# Patient Record
Sex: Male | Born: 1960 | Race: White | Hispanic: No | Marital: Single | Smoking: Never smoker
Health system: Southern US, Community
[De-identification: ages and names within clinical notes are randomized; demographics above are authoritative.]

## PROBLEM LIST (undated history)

## (undated) DIAGNOSIS — S2239XA Fracture of one rib, unspecified side, initial encounter for closed fracture: Secondary | ICD-10-CM

## (undated) DIAGNOSIS — R112 Nausea with vomiting, unspecified: Secondary | ICD-10-CM

## (undated) DIAGNOSIS — K219 Gastro-esophageal reflux disease without esophagitis: Secondary | ICD-10-CM

## (undated) DIAGNOSIS — Z9889 Other specified postprocedural states: Secondary | ICD-10-CM

## (undated) DIAGNOSIS — I10 Essential (primary) hypertension: Secondary | ICD-10-CM

---

## 2007-02-04 ENCOUNTER — Encounter
Admission: RE | Admit: 2007-02-04 | Discharge: 2007-05-05 | Payer: Self-pay | Admitting: Physical Medicine & Rehabilitation

## 2007-02-04 ENCOUNTER — Ambulatory Visit: Payer: Self-pay | Admitting: Physical Medicine & Rehabilitation

## 2007-03-18 ENCOUNTER — Ambulatory Visit: Payer: Self-pay | Admitting: Physical Medicine & Rehabilitation

## 2007-03-28 ENCOUNTER — Encounter: Admission: RE | Admit: 2007-03-28 | Discharge: 2007-03-28 | Payer: Self-pay | Admitting: Orthopedic Surgery

## 2007-04-07 HISTORY — PX: JOINT REPLACEMENT: SHX530

## 2007-04-11 ENCOUNTER — Encounter
Admission: RE | Admit: 2007-04-11 | Discharge: 2007-07-10 | Payer: Self-pay | Admitting: Physical Medicine & Rehabilitation

## 2007-04-25 ENCOUNTER — Ambulatory Visit: Payer: Self-pay | Admitting: Physical Medicine & Rehabilitation

## 2007-05-02 ENCOUNTER — Inpatient Hospital Stay (HOSPITAL_COMMUNITY): Admission: RE | Admit: 2007-05-02 | Discharge: 2007-05-05 | Payer: Self-pay | Admitting: Orthopedic Surgery

## 2007-06-02 ENCOUNTER — Ambulatory Visit: Payer: Self-pay | Admitting: Physical Medicine & Rehabilitation

## 2007-07-07 ENCOUNTER — Encounter
Admission: RE | Admit: 2007-07-07 | Discharge: 2007-10-05 | Payer: Self-pay | Admitting: Physical Medicine & Rehabilitation

## 2007-07-11 ENCOUNTER — Ambulatory Visit: Payer: Self-pay | Admitting: Physical Medicine & Rehabilitation

## 2007-07-25 ENCOUNTER — Ambulatory Visit: Payer: Self-pay | Admitting: Physical Medicine & Rehabilitation

## 2007-09-21 ENCOUNTER — Encounter
Admission: RE | Admit: 2007-09-21 | Discharge: 2007-09-26 | Payer: Self-pay | Admitting: Physical Medicine & Rehabilitation

## 2007-09-26 ENCOUNTER — Ambulatory Visit: Payer: Self-pay | Admitting: Physical Medicine & Rehabilitation

## 2007-12-16 ENCOUNTER — Encounter
Admission: RE | Admit: 2007-12-16 | Discharge: 2008-03-15 | Payer: Self-pay | Admitting: Physical Medicine & Rehabilitation

## 2007-12-19 ENCOUNTER — Ambulatory Visit: Payer: Self-pay | Admitting: Physical Medicine & Rehabilitation

## 2008-01-02 ENCOUNTER — Ambulatory Visit: Payer: Self-pay | Admitting: Physical Medicine & Rehabilitation

## 2008-02-13 ENCOUNTER — Ambulatory Visit: Payer: Self-pay | Admitting: Physical Medicine & Rehabilitation

## 2008-04-27 ENCOUNTER — Encounter
Admission: RE | Admit: 2008-04-27 | Discharge: 2008-07-26 | Payer: Self-pay | Admitting: Physical Medicine & Rehabilitation

## 2008-04-30 ENCOUNTER — Ambulatory Visit: Payer: Self-pay | Admitting: Physical Medicine & Rehabilitation

## 2008-05-21 ENCOUNTER — Ambulatory Visit: Payer: Self-pay | Admitting: Physical Medicine & Rehabilitation

## 2008-05-28 ENCOUNTER — Ambulatory Visit: Payer: Self-pay | Admitting: Physical Medicine & Rehabilitation

## 2008-06-04 ENCOUNTER — Ambulatory Visit: Payer: Self-pay | Admitting: Physical Medicine & Rehabilitation

## 2008-06-18 ENCOUNTER — Ambulatory Visit: Payer: Self-pay | Admitting: Physical Medicine & Rehabilitation

## 2008-08-01 ENCOUNTER — Encounter
Admission: RE | Admit: 2008-08-01 | Discharge: 2008-10-30 | Payer: Self-pay | Admitting: Physical Medicine & Rehabilitation

## 2008-08-06 ENCOUNTER — Ambulatory Visit: Payer: Self-pay | Admitting: Physical Medicine & Rehabilitation

## 2008-08-13 ENCOUNTER — Ambulatory Visit: Payer: Self-pay | Admitting: Physical Medicine & Rehabilitation

## 2008-08-20 ENCOUNTER — Ambulatory Visit: Payer: Self-pay | Admitting: Physical Medicine & Rehabilitation

## 2008-08-27 ENCOUNTER — Ambulatory Visit: Payer: Self-pay | Admitting: Physical Medicine & Rehabilitation

## 2008-11-20 ENCOUNTER — Encounter
Admission: RE | Admit: 2008-11-20 | Discharge: 2008-11-26 | Payer: Self-pay | Admitting: Physical Medicine & Rehabilitation

## 2008-11-26 ENCOUNTER — Ambulatory Visit: Payer: Self-pay | Admitting: Physical Medicine & Rehabilitation

## 2009-05-23 ENCOUNTER — Encounter
Admission: RE | Admit: 2009-05-23 | Discharge: 2009-05-27 | Payer: Self-pay | Admitting: Physical Medicine & Rehabilitation

## 2009-05-27 ENCOUNTER — Ambulatory Visit: Payer: Self-pay | Admitting: Physical Medicine & Rehabilitation

## 2009-10-11 ENCOUNTER — Emergency Department (HOSPITAL_BASED_OUTPATIENT_CLINIC_OR_DEPARTMENT_OTHER): Admission: EM | Admit: 2009-10-11 | Discharge: 2009-10-11 | Payer: Self-pay | Admitting: Emergency Medicine

## 2009-10-11 ENCOUNTER — Ambulatory Visit: Payer: Self-pay | Admitting: Diagnostic Radiology

## 2009-10-24 ENCOUNTER — Encounter
Admission: RE | Admit: 2009-10-24 | Discharge: 2010-01-22 | Payer: Self-pay | Admitting: Physical Medicine & Rehabilitation

## 2009-10-31 ENCOUNTER — Ambulatory Visit: Payer: Self-pay | Admitting: Physical Medicine & Rehabilitation

## 2009-12-02 ENCOUNTER — Ambulatory Visit: Payer: Self-pay | Admitting: Physical Medicine & Rehabilitation

## 2010-04-07 ENCOUNTER — Ambulatory Visit: Payer: Self-pay | Admitting: Physical Medicine & Rehabilitation

## 2010-04-07 ENCOUNTER — Encounter
Admission: RE | Admit: 2010-04-07 | Discharge: 2010-05-06 | Payer: Self-pay | Source: Home / Self Care | Attending: Physical Medicine & Rehabilitation | Admitting: Physical Medicine & Rehabilitation

## 2010-08-19 NOTE — Procedures (Signed)
Anthony [name], Anthony [name]                 ACCOUNT NO.:  [id number]   MEDICAL RECORD NO.:  [id number]          PATIENT TYPE:  REC   LOCATION:  [facility]                          FACILITY:  [facility]   PHYSICIAN:  Erick Colace, M.D.DATE OF BIRTH:  11-10-1960   DATE OF PROCEDURE:  11/26/2008  DATE OF DISCHARGE:                               OPERATIVE REPORT   PROCEDURE:  Left sacroiliac injection L4 medial branch block, L5-S1, S2-  S3 dorsal ramus injections under fluoroscopic guidance.   INDICATIONS:  Lumbar facet low back pain and sacroiliac disorder.   Informed consent was obtained after describing risks and benefits of the  procedure with the patient.  These include bleeding, bruising,  infection, he elects to proceed and has given written consent.  The  patient placed prone on fluoroscopy table.  Betadine prep, sterile  drape, 25-gauge inch and half needle was used to anesthetize the skin  and subcutaneous tissue, 1% lidocaine x2 mL, then 20 gauge 10-cm RF  needle with 10-mm curved active tip was inserted under fluoroscopic  guidance first starting in the left S1 SAP sacral ala junction, bone  contact made, confirmed with lateral imaging.  Sensory stem at 50 Hz  followed by motor stem at 2 Hz, confirmed proper needle location  followed by injection of 0.75 mL of dexamethasone lidocaine solution.  The left S1 lateral aspect of the sacral foramen was approximated with  the needle.  Sensory stem at 50 Hz confirmed proper needle location  followed by injection of 0.75 mL of dexamethasone lidocaine solution and  radiofrequency lesioning at 70 degrees Celsius for 70 seconds within the  left S2.  Lateral aspect of the foramen was approximated with the needle  followed bone contact was made.  Sensory stem at 50 Hz confirmed proper  needle location followed by injection of 1 mL of 0.75 mL of  dexamethasone lidocaine solution.  Then, the left S3 lateral aspect of  the sacral foramen was approximated  with the needle, bone contact made.  Sensory stem at 50 Hz confirmed proper needle location followed by  injection of 0.75 mL of dexamethasone lidocaine solution.  The patient  tolerated the procedure well.  Pre and post injection vitals stable.  Post injection instructions given.  Return in 1 month for right-side  injection.      Erick Colace, M.D.  Electronically Signed     AEK/MEDQ  D:  11/26/2008 09:38:07  T:  11/26/2008 23:35:25  Job:  [id number]

## 2010-08-19 NOTE — Procedures (Signed)
NAMEJOVANN, LUSE                 ACCOUNT NO.:  [id number]   MEDICAL RECORD NO.:  [id number]          PATIENT TYPE:  REC   LOCATION:  [facility]                          FACILITY:  [facility]   PHYSICIAN:  Erick Colace, M.D.DATE OF BIRTH:  [birth year]   DATE OF PROCEDURE:  DATE OF DISCHARGE:                               OPERATIVE REPORT   Mr. Schuld returns today.  This is a diagnosis of lumbosacral pain.   PROCEDURE:  Left L4 medial branch, left L5 dorsal ramus radiofrequency  neurotomy, left S1, S2, S3 dorsal ramus radiofrequency neurotomy.  This  is under lumbar and sacral fluoroscopic guidance.   The patient has pain, which interferes with self-care mobility and job  as responded to at his procedure in the past, last performed  approximately 6 months ago.   Informed consent was obtained after describing the risks and benefits of  the procedure with the patient.  These include bleeding, bruising,  infection, he elects to proceed and has given written consent.  The  patient placed prone on fluoroscopy table.  Betadine prep, sterile drape  25-gauge inch-and-a-half needle was used to anesthetize skin and  subcutaneous tissue, 1% lidocaine x2 mL.  A 22-gauge 3.5-inch spinal  needle was inserted under fluoroscopic guidance first starting the left  S1 SAP sacral junction, bone contact made, confirmed with lateral  imaging.  Sensory stem at 50 Hz followed by motor stem at 2 Hz confirmed  proper needle location followed by injection of 1 mL of solution  containing 1 mL of 4 mg/mL dexamethasone, 4 mL of 1% MPF lidocaine  followed by radiofrequency lesioning at 70 degrees Celsius for 70  seconds.  Then left L5 SAP transverse process junction targeted, bone  contact made, confirmed with lateral imaging.  Sensory stem at 50 Hz  followed by motor stem at 2 Hz confirmed proper needle location followed  by injection of 1 mL of dexamethasone and lidocaine solution and  radiofrequency lesioning at  70 degrees Celsius x 70 seconds.  Then, the  left lateral aspect of the left S1 foramen was targeted, needle was made  just lateral to the foramen, and bone contact made.  Sensory stem at 50  Hz confirmed proper needle location followed by injection of 1 mL of  dexamethasone lidocaine solution and radiofrequency lesioning 70 degrees  Celsius for 70 seconds.  This was performed once again at S2 and at S3  using same needle technique and procedure.  The patient tolerated  procedure well.  Postprocedure instructions given.  Preinjection pain  level 4/10.  Post injection 1/10.      Erick Colace, M.D.  Electronically Signed     AEK/MEDQ  D:  06/18/2008 17:11:13  T:  06/19/2008 07:27:15  Job:  [id number]

## 2010-08-19 NOTE — Procedures (Signed)
NAMESTEVENSON, Anthony [name]                 ACCOUNT NO.:  [id number]   MEDICAL RECORD NO.:  [id number]           PATIENT TYPE:   LOCATION:                                 FACILITY:   PHYSICIAN:  Erick Colace, M.D.DATE OF BIRTH:  10-03-1960   DATE OF PROCEDURE:  DATE OF DISCHARGE:                               OPERATIVE REPORT   ADDENDUM   PROCEDURE:  Left L4 medial-branch radiofrequency neurotomy, left L5-S1,  S2-S3 dorsal ramus radiofrequency neurotomy, and left sacroiliac joint  injection.   INDICATION:  1. Lumbar pain and further radiofrequency procedures.  2. 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  The      patient's pain has failed to respond to a 3 months of conserve      management consisting of medications, physical therapy, and home      exercise program.  He does maintain a home exercise program, has      gone through physical therapy in the past as well as medication      management.  He has responded to control diagnostic medial-branch      block under fluoroscopic guidance with at least 50% reduction of      pain and has had prior successful radiofrequency neurotomy with the      last procedure having been performed approximately 6 months ago.   Informed consent was obtained after describing risks and benefits of the  procedure with the patient.  These include bleeding, bruising, and  infection.  He elects to proceed and has given written consent.  The  patient was placed prone on fluoroscopy table.  Betadine prep and  sterile drape.  1% lidocaine x9 mL injected along the lumbosacral  paraspinal area followed by insertion of a 20-gauge 10-cm RF needle with  10-mm curved active tip, first charting left S1 SAP sacral ala junction,  bone contact made, confirmed with lateral imaging.  Sensory stem at 50  Hz followed by motor stem at 2 Hz confirmed proper needle location  followed by injection of 0.75 mL of the solution containing 1 mL of 4  mg/mL,  dexamethasone 1mL of 1% MPF lidocaine followed by radiofrequency  lesioning 70 degrees Celsius for 70 seconds in the left L5, SAP  transverse process junction, targeted bone contact made, confirmed with  lateral images.  Sensory stem at 50 Hz followed by motor stem at 2 Hz  confirmed proper needle location followed by injection of 1 mL and then  0.75 mL of dexamethasone lidocaine solution.  The left S1 sacral foramen  lateral aspect was targeted bone contact made.  Sensory stem at 50 Hz,  confirmed proper needle location followed by injection of 0.75 mL of  dexamethasone lidocaine solution and radiofrequency lesioning 70 degrees  Celsius x70 seconds in the left S2 lateral aspect of the sacral foramen.  Targeted bone contact made.  Sensory stem at 50 Hz, confirmed proper  needle location followed by injection of 0.75 mL of dexamethasone  lidocaine solution followed by radiofrequency lesioning 70 degrees  Celsius for 70 seconds and the left S3 lateral aspect of  the S3 foramen  with targeted bone contact made.  Sensory stem at 50 Hz confirmed proper  needle location followed by injection of 0.75 mL of the dexamethasone  lidocaine solution and radiofrequency lesioning 70 degrees Celsius for  70 seconds.  The patient tolerated the procedure well.  Pre and post  injection vitals stable.  Post injection instructions given.  In  addition, the sacroiliac was injected targeting the sacroiliac joint  under AP and lateral imaging using a 25 gauge 3  inches spinal needle.  Omnipaque 180 and confirmed proper needle  location followed by injection of 1 cm of a solution containing 1.5 mL  for 4 mg/mL dexamethasone and 1.5 mL of 1% lidocaine.      Erick Colace, M.D.  Electronically Signed     AEK/MEDQ  D:  11/26/2008 09:47:01  T:  11/26/2008 23:19:27  Job:  [id number]

## 2010-08-19 NOTE — Procedures (Signed)
Anthony [name], Anthony [name]                 ACCOUNT NO.:  [id number]   MEDICAL RECORD NO.:  [id number]          PATIENT TYPE:  REC   LOCATION:  [facility]                          FACILITY:  [facility]   PHYSICIAN:  Erick Colace, M.D.DATE OF BIRTH:  [birth year]   DATE OF PROCEDURE:  05/28/2008  DATE OF DISCHARGE:                               OPERATIVE REPORT   Oswestry score today is 26%.  Acupuncture visit indication is shoulder  pain, postherpetic neuralgia.   Needles placed at SI10, SJ14, LI14, SI14, BL11, BL12, BL13, BL14, 4 Hz  stimulation x30 minutes.  The patient tolerated the procedure well.      Erick Colace, M.D.  Electronically Signed     AEK/MEDQ  D:  05/28/2008 16:33:44  T:  05/29/2008 04:05:37  Job:  [id number]

## 2010-08-19 NOTE — Procedures (Signed)
Anthony [name], Anthony [name]                 ACCOUNT NO.:  [id number]   MEDICAL RECORD NO.:  [id number]           PATIENT TYPE:   LOCATION:                                 FACILITY:   PHYSICIAN:  Erick Colace, M.D.DATE OF BIRTH:  26-Jan-1961   DATE OF PROCEDURE:  DATE OF DISCHARGE:                               OPERATIVE REPORT   This is acupuncture treatment.   INDICATIONS:  Sacroiliac disorder as well as left parascapular  myofascial pain.   Pain is only partial responsive to conservative care including physical  therapy.   The patient has been benefiting from acupuncture treatments and his last  treatment was performed on Aug 20, 2008, and this will be the seventh in  a series since February 2010.   The patient placed prone on exam table.  Acupuncture needles placed in  Baldry technique left SI 11.  Then, needles placed at bilateral BL 21,  BL 27, left BL 23, left BL 25, left GB 30, as well as left BL 51, 52,  53, 54, 4 Hz stimulation 30 minutes.  The patient tolerated the  procedure well.  Postprocedure instructions given.  Return in 2 weeks.      Erick Colace, M.D.  Electronically Signed     AEK/MEDQ  D:  08/27/2008 11:49:18  T:  08/28/2008 04:53:41  Job:  [id number]

## 2010-08-19 NOTE — Procedures (Signed)
Anthony [name], Anthony [name]                 ACCOUNT NO.:  [id number]   MEDICAL RECORD NO.:  [id number]          PATIENT TYPE:  REC   LOCATION:  [facility]                          FACILITY:  [facility]   PHYSICIAN:  Erick Colace, M.D.DATE OF BIRTH:  10-Jun-1960   DATE OF PROCEDURE:  DATE OF DISCHARGE:                               OPERATIVE REPORT   INDICATION:  Sacroiliac disorder as well as infraspinatus myofascial  pain.   Pain is only partially responsive to medication management and other  conservative care and impairs his mobility and self-care.   The patient was placed prone on exam table.  Acupuncture needle was  placed in Baldry technique, left SI 11 as well as needles placed at  bilateral BL21, BL27 as well as the left BL23, left BL25, and left GB30  as well as left BL51, BL52, BL53, BL54; 4 Hz stimulation x30 minutes.  The patient tolerated the procedure well.  Postprocedure instructions  given.      Erick Colace, M.D.  Electronically Signed     AEK/MEDQ  D:  08/06/2008 16:55:39  T:  08/07/2008 09:15:26  Job:  [id number]

## 2010-08-19 NOTE — H&P (Signed)
NAMEZORIAN, GUNDERMAN                 ACCOUNT NO.:  [id number]   MEDICAL RECORD NO.:  [id number]          PATIENT TYPE:  INP   LOCATION:  NA                           FACILITY:  [facility]   PHYSICIAN:  Ollen Gross, M.D.    DATE OF BIRTH:  30-Jan-1961   DATE OF ADMISSION:  05/02/2007  DATE OF DISCHARGE:                              HISTORY & PHYSICAL   DATE OF OFFICE VISIT HISTORY AND PHYSICAL:  04/12/2007   CHIEF COMPLAINT:  Right hip pain.   HISTORY OF PRESENT ILLNESS:  The patient is a  50 year old male whose  seen by Dr. Lequita Halt for ongoing hip pain.  He has known end-stage  arthritis that has progressively gotten worse with time.  He is seen and  the x-rays in the office show that he has bone-on-bone in the right hip,  loss of the joint space. It is felt he has reached a point where he  could benefit under surgical intervention.  The risks and benefits have  been discussed.  He elects to proceed with surgery.   ALLERGIES:  PENICILLIN which Dr. Leonette Most has it documented as an  anaphylactic type reaction.   CURRENT MEDICATIONS:  Mobic, AcipHex, pravastatin. He has also been on  glucosamine complex.   PAST MEDICAL HISTORY:  History of peptic ulcer disease, history of  bronchitis, allergic rhinitis, degenerative disk disease, esophageal  reflux, hiatal hernia, hyperlipidemia, insomnia, mild hypertension,  irritable bowel syndrome, diverticulosis.   PAST SURGICAL HISTORY:  Right shoulder and elbow surgery, left shoulder  surgery.  He has also undergone an EGD and colonoscopy.   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 His wife will be assisting with  care after surgery.  One-story home with two small steps entering the  house.   FAMILY HISTORY:  Father with history of heart disease.  Family history  also is noted for hypertension, diabetes, arthritis.   REVIEW OF SYSTEMS:  GENERAL:  No fevers, chills, night  sweats.  NEUROLOGIC:  No seizures, syncope or paralysis.  RESPIRATORY:  No  shortness of breath, productive cough or hemoptysis.  He has had a  recent bout of bronchitis.  He has already been treated with Mucinex in  a Z-Pak.  He was already seen in his medical Clinic prior to surgery.  CARDIOVASCULAR:  No chest pain, angina, orthopnea. GI: No nausea,  vomiting, diarrhea or constipation.  GU: No dysuria, hematuria, or  discharge.   PHYSICAL EXAM:  VITAL SIGNS:  Pulse 64, respirations 12, blood pressure  112/84.  GENERAL:  A 50 year old white male well-nourished, well-developed in no  acute distress. Alert, oriented and cooperative, accompanied by his  wife.  HEENT:  Normocephalic, atraumatic.  Pupils round and reactive.  Oropharynx clear.  EOMs intact.  NECK:  Supple.  CHEST:  Slight rhonchi at the bases on the right otherwise clear  throughout.  HEART:  Regular rate and rhythm.  ABDOMEN:  Soft, nontender.  Bowel sounds present.  RECTAL/BREASTS/GENITALIA:  Not done not pertinent to present illness.  EXTREMITIES:  Right hip  flexion 90, 0 internal rotation, 10 degrees  external rotation, 10 degrees abduction.   IMPRESSION:  1. Osteoarthritis right hip.  2. Peptic ulcer disease.  3. History of bronchitis.  4. Allergic rhinitis.  5. Degenerative disk disease.  6. Esophageal reflux.  7. Hiatal hernia.  8. Hyperlipidemia.  9. Insomnia.  10.Mild hypertension.  11.Irritable bowel syndrome.  12.Diverticulosis.   PLAN:  The patient admitted to [facility] to undergo a right  total hip. The surgery will be performed by Dr. Ollen Gross.      Alexzandrew L. Perkins, P.A.C.      Ollen Gross, M.D.  Electronically Signed    ALP/MEDQ  D:  05/01/2007  T:  05/02/2007  Job:  [id number]   cc:   Loyal Jacobson, MD  [facility]  [address]  [place], [place] [address]

## 2010-08-19 NOTE — Procedures (Signed)
Anthony [name], Anthony [name]                 ACCOUNT NO.:  [id number]   MEDICAL RECORD NO.:  [id number]          PATIENT TYPE:  REC   LOCATION:  [facility]                          FACILITY:  [facility]   PHYSICIAN:  Erick Colace, M.D.DATE OF BIRTH:  10/28/1960   DATE OF PROCEDURE:  06/13/2007  DATE OF DISCHARGE:                               OPERATIVE REPORT   Left L4 medial branch block L5 dorsal ramus injection, left S1, S2, and  S3 dorsal ramus injection, left sacroiliac injection and radiofrequency  neurotomy under fluoroscopic guidance.   Anthony [name] is here for his radiofrequency, sacroiliac, left side.   INDICATIONS:  Sacroiliac pain only partially responsive to medication  management and only short-term relief with sacroiliac injections.     Informed consent was obtained after describing the risks and benefits of  the procedure to the patient.  These include bleeding, bruising,  infection, temporary or permanent paralysis.  He elects to proceed and  has given written consent.  The patient placed prone on fluoroscopy  table.  Betadine prep, sterile drape.  A 25-gauge 1-1/2-inch needle was  used to anesthetize skin and subcutaneous tissue with 1% lidocaine x2  mL.  Then, a 25-gauge 3-inch spinal needle was inserted in the left SI  joint with AP lateral oblique imaging.  After negative drawback for  blood, 1 mL of 2% MPF lidocaine plus 0.5% mL of 40 mg/mL Depo-Medrol was  injected.  Then, the 20-gauge 10-cm RF needle with the 10-mm curved  active tip was inserted under fluoroscopic guidance, first targeting the  lateral aspect of the left S3 foramen under fluoroscopic imaging.  Bone  contact made.  Sensory stem 50 Hz confirmed proper needle location  followed by injection of 1 mL of a solution containing 4 mL of 1% MPF  lidocaine and 1 mL of 40 mg/mL dexamethasone followed by radiofrequency  lesioning 70 degrees for 70 seconds.  Then, the lateral aspect of left  S2 foramen was similarly  approached with radiofrequency needle.  Bone  contact was made.  Sensory stem confirmed proper needle location,  followed by injection of 1 mL of the dexamethasone/lidocaine solution  and radiofrequency lesioning 70 degrees Celsius for 70 seconds.  Then,  the left S1 foramen lateral aspect was approached using the RF needle.  Bone contact made, confirmed with sensory stimulation and lateral  imaging, followed by injection 1 mL of the dexamethasone/lidocaine  solution and radiofrequency lesioning 70 degrees Celsius for 70 seconds.  Then, the left S1 SAP sacral ala junction targeted.  Bone contact made,  confirmed with lateral imaging.  Sensory stem at 50 Hz followed by motor  stem 2 Hz demonstrated proper needle location followed by injection of 1  mL of the dexamethasone/lidocaine solution and radiofrequency lesioning  70 degrees for 70 seconds.  Then, the left L5 SAP sacral ala junction  targeted.  Bone contact made, confirmed with lateral imaging.  Sensory  stem at 50 Hz followed by motor stem at 2 Hz confirmed proper needle  location followed by injection of 1 mL of the dexamethasone/lidocaine  solution and radiofrequency  lesioning 70 degrees for 70 seconds.  The  patient tolerated the  procedure well.  Pre/post injection vitals stable.  Post injection  instructions given.  Return in 1 month for follow-up appointment.      Erick Colace, M.D.  Electronically Signed     AEK/MEDQ  D:  06/13/2007 10:24:29  T:  06/14/2007 09:10:39  Job:  [id number]   cc:   Ollen Gross, M.D.  Fax: [phone]

## 2010-08-19 NOTE — Procedures (Signed)
NAMEBRANSTON, [name]                 ACCOUNT NO.:  [id number]   MEDICAL RECORD NO.:  [id number]          PATIENT TYPE:  REC   LOCATION:  [facility]                          FACILITY:  [facility]   PHYSICIAN:  Erick Colace, M.D.DATE OF BIRTH:  02-03-1961   DATE OF PROCEDURE:  07/25/2007  DATE OF DISCHARGE:                               OPERATIVE REPORT   PROCEDURE:  Right L4 medial branch radiofrequency neurotomy.  Right L5 dorsal ramus radiofrequency neurotomy.  Right S1, S2, S3 dorsal ramus radiofrequency neurotomy.  Fluoroscopic guidance of both sacral and lumbar areas.   INDICATION:  Lumbar facet syndrome and sacroiliac disorder.  Lumbosacral  spondylosis without myelopathy.  721.2.   Right-sided low back and buttock pain only partially relieved with  medication management interfering with activity level.  She had positive  results from left-sided treatment.   Informed consent was obtained after describing risks and benefits of the  procedure with the patient.  These include bleeding, bruising,  infection.  He elects to proceed and has given written consent. The  patient placed prone on fluoroscopy table.  Betadine prep, sterile  drape.  A 25-gauge inch and a half needle was used to anesthetize skin  and subcu tissue.  1% lidocaine x2 mL.  Then a 20 gauge 10-cm RF needle  with 10 mm curved active tip was inserted under fluoroscopic guidance,  first targeting the right S1 SAP sacral ala junction. Bone contact made,  confirmed with lateral imaging.  Sensory stim at 50 Hz followed by motor  stim at 2 Hz confirmed proper needle location followed by injection 1/2  mL of a solution containing 1 mL of 4 mg per mL dexamethasone, 2 mL of  1% MPF lidocaine followed by radiofrequency lesioning at 70 degrees  Celsius for 70 seconds.  In the right L5 SAP transverse process junction  targeted.  Bone contact made confirming lateral imaging.  Sensory stim  at 50 Hz followed by motor stim at 2 Hz  confirmed proper needle location  followed by injection of 0.5 mL of the dexamethasone lidocaine solution  and radiofrequency lesioning 70 degrees x 70 seconds.  Then the  lateral  aspect of the S1 neural foramen was targeted.  Bone contact made.  Sensory stim at 50 Hz confirmed proper needle location followed by  injection of 1/2 mL of dexamethasone lidocaine solution and  radiofrequency lesioning 70 degrees for 70 seconds.  The right S2  foramen lateral aspect was then targeted. Bone contact made.  Sensory  stim at 50 Hz confirmed proper needle location followed by injection of  1/2 mL of dexamethasone lidocaine solution and radiofrequency at 70  degrees for 70 seconds.  The right S3 neural foramen was targeted. Bone  contact made, confirmed with sensory stimulation 50 Hz followed by  injection 1/2 mL of dexamethasone lidocaine solution and radiofrequency  70 degrees x 70 seconds.  The patient tolerated procedure well.  Pre and post injection vitals  stable.  Post injection instructions given.  Follow with me in 1 month.      Erick Colace, M.D.  Electronically Signed     AEK/MEDQ  D:  07/25/2007 09:45:34  T:  07/25/2007 10:05:33  Job:  [id number]   cc:   Ollen Gross, M.D.  Fax: [phone]

## 2010-08-19 NOTE — Procedures (Signed)
[name], [name]                 ACCOUNT NO.:  [id number]   MEDICAL RECORD NO.:  [id number]          PATIENT TYPE:  REC   LOCATION:  [facility]                          FACILITY:  [facility]   PHYSICIAN:  Erick Colace, M.D.DATE OF BIRTH:  13-Nov-1960   DATE OF PROCEDURE:  03/21/2007  DATE OF DISCHARGE:                               OPERATIVE REPORT   PROCEDURE:  Bilateral L5 dorsal ramus injection and radiofrequency  neurotomy, bilateral L4 medial branch blocks and radiofrequency  neurotomy and bilateral L3 medial branch blocks and radiofrequency  neurotomy under fluoroscopic guidance.   INDICATIONS:  Lumbar facet mediated pain.  He had good relief of his  axial back pain on the left side.  When he had procedure done on the  left side on February 28, 2007, he noticed increased pain on the right  side comparatively and therefore will get bilateral injections today.  His pain is only partially responsive to medication management.  He has  confounding variable of right hip OA altering gait pattern as well.   Informed consent was obtained after describing risks and benefits of the  procedure to the patient.  These include bleeding, bruising, infection,  loss of bowel and bladder function, temporary or permanent paralysis.  He elects to proceed and has given written consent.  The patient placed  prone on fluoroscopy table.  Betadine prep, sterile drape, 25-gauge inch  and a half needle was used to anesthetize the skin and subcu tissue 1%  lidocaine x2 mL at each of six sites.  Then a 22 gauge three and a half  inch spinal needle was inserted, first targeting the left S1 SAP sacral  ala junction.  Bone contact made, confirmed  with lateral imaging.  Omnipaque 180 under live fluoro demonstrated no intravascular uptake.  Then a solution and 1 mL of 4 mg/mL dexamethasone 2 mL of 2% MPF  lidocaine x0.5 mL were injected.  Then the left L5 SAP transverse  process junction targeted.  Bone contact  made and confirmed with lateral  imaging.  Omnipaque 180 x0.5 mL demonstrated no intravascular uptake.  Then 0.5 mL of the dexamethasone lidocaine solution was injected.  Then  the left L4 SAP transverse process junction targeted.  Bone contact  made, confirmed with lateral imaging.  Omnipaque 180 x0.5 mL  demonstrated no intravascular uptake and 0.5 mL of dexamethasone  lidocaine solution was injected.  The same procedure was then done on  the corresponding levels on the right side with the same equipment,  injectate and technique.  The patient tolerated the procedure well.  Pre  and post injection vitals stable.  Pre-injection pain level 5/10, post  injection 0/10.  Will have return follow-up appointment for one month.  Discuss RF versus other treatment options depending on his pain level.  Also need to coordinate in regards to his hip surgery that is upcoming  in six weeks.      Erick Colace, M.D.  Electronically Signed     AEK/MEDQ  D:  03/21/2007 15:29:29  T:  03/22/2007 09:54:30  Job:  [id number]

## 2010-08-19 NOTE — Procedures (Signed)
Anthony [name], Anthony [name]                 ACCOUNT NO.:  [id number]   MEDICAL RECORD NO.:  [id number]          PATIENT TYPE:  INP   LOCATION:  NA                           FACILITY:  [facility]   PHYSICIAN:  Erick Colace, M.D.DATE OF BIRTH:  01-May-1960   DATE OF PROCEDURE:  04/25/2007  DATE OF DISCHARGE:                               OPERATIVE REPORT   This is bilateral sacroiliac injection under fluoroscopic guidance.   The patient returns today.  He is scheduled for right hip replacement  and would like some relief of his sacroiliac pain prior to his scheduled  surgery.   Pain is only partially responsive to medication management,   pain interferes with his mobility.   Informed consent was obtained after describing risks and benefits to the  patient.  These include bleeding, bruising, infection, he elects to  proceed and has given written consent.  The patient placed prone on  fluoroscopy table.  Betadine prep, sterile drape,  25-gauge 1-1/2 inch  needle was used to anesthetize skin and subcu tissue 1% lidocaine x2 ccs  and then a 25 gauge 3 inches spinal needle was inserted first the left  SI joint AP lateral oblique imaging.  Omnipaque 180 demonstrated no  intravascular uptake then a solution containing 0.5 mL of 40 mg per mL  Depo-Medrol and 1 mL of 2% MPF lidocaine.  The same procedure was  repeated on the right side given the increasing symptomatology that side  using same needle, injectate and technique.   The patient tolerated procedure well.  Pre injection pain level 09/10  post injection 5/10.  He will see me in 2 months.  Will see how he does  postoperatively with his hip replacement and decide whether he needs  sacroiliac radiofrequency procedure at the time.      Erick Colace, M.D.  Electronically Signed     AEK/MEDQ  D:  04/25/2007 12:16:37  T:  04/25/2007 12:58:10  Job:  [id number]   cc:   Ollen Gross, M.D.  Fax: [phone]

## 2010-08-19 NOTE — Group Therapy Note (Signed)
REASON FOR CONSULTATION:  Sacroiliac pain evaluation for further  treatment.   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  He has had about a 20% relief with sacroiliac injections, which  has been done on several occasions per Dr. Ethelene Hal.  He has had no  significant relief with L5-S1 epidural steroid injection.  He also has  right hip OA.   He has endstage DJD of the right hip and is scheduled for a hip  replacement in January.  He feels that the way he walks has been altered  by his arthritis, however, his pain in his left hip is worse with  prolonged sitting.   He has been on Mobic; this has helped moderately.  He has tried narcotic  analgesics and he cannot even tolerate Tramadol.   His other past medical history is significant for carpal tunnel release  bilaterally, he has had a right ulnar nerve transposition.  In addition,  he has had right distal biceps tendon rupture.   CURRENT MEDICATIONS:  1. Meloxicam 15 mg a day.  2. Pravastatin 40 mg a day.  3. Aciphex 20 mg a day.  4. He has taken tramadol at times, but, once again, this causes      sedation.   His pain is sharp, but not constant, but occurs with prolonged sitting  and bending.  Average pain 4-5; improved with therapy, medication, TENS,  and injections.  He can walk 1 hour at a time, he climbs steps, and he  drives.  He used to work 50 hours a week.   REVIEW OF SYSTEMS:  Positive for nausea, diarrhea, abdominal pain and  has a diagnosis of IBS.   PAST HISTORY:  As noted above.   SOCIAL HISTORY:  Is married, has a child, who is with him today, who is  59 years old.   FAMILY HISTORY:  Heart disease, lung disease, diabetes, high blood  pressure, and alcohol abuse.   OTHER THERAPIES TRIED:  He has been getting some chiropractic treatments  with adjustments as well as integrative therapy for more  myofascial  approach as well as postural approach.  He has tried some bracing on his  right lower extremity.   PHYSICAL EXAMINATION:  GENERAL:  In no acute distress.  Mood and affect  appropriate.  Blood pressure 108/77, pulse 81, respiratory rate 18, and O2 sating 97%  on room air.  His gait does tend to externally rotate his right hip when he walks.  He  walks on the lateral border of the right foot, he does have a callus  over the right foot.  Sensation is intact in the lower extremities and  upper extremities with the exception of left L4 dermatome, which is  mildly reduced to pinprick compared to the right side.  His motor  strength is 4 bilateral deltoid, biceps, triceps, grip as well as hip  flexor, knee extensor, ankle dorsiflexor and EHL.  He has some  difficulty walking on the toes as well as the heel.  He states that his  balance is off due to his hip arthritis on the right side.  He has some  tenderness to the left PSIS.  He has positive Faber's, referring pain to  the left PSIS area.  His hip range of motion has decreased internal  rotation on the right side; external rotation is intact.  His back has some tenderness with some right-sided lateral bending, not  left-sided, and not particularly with extension.   IMPRESSION:  Left lower back and buttock pain.  He has some elements  suggestive of sacroiliac process.  He has no history of psoriasis or  inflammatory bowel disease or any other problem that predisposes him for  sacroiliitis per se.  He has had fairly minimal relief with sacroiliac  injections, per his report about 20%, so that may not be the only pain  generator in that area.  Discussed other potential pain generators  including lumbar facet and would like to check medial branch blocks to  the left L4-L5, left L5-S1 to see how much they contribute.  Certainly  sacroiliac radiofrequency would include medial branch blocks and RF of  those medial branches in  addition to the S1, S2 and S3.  I explained to  the patient that if he is one of the patients that has some ventral  contribution to the SI joint, the radiofrequency would not have as good  a result as if it was a posteriorly innervated SI.   We will set him up for the medial branches and proceed from there.  He  can continue with his PT as well as his chiropractic treatment.  He will  need to come off his Mobic for the injections.  I told him we should  know within a couple of weeks after the medial branch blocks whether we  will be ending up doing the lumbar and sacroiliac RF.   Thank you very much for this interesting consultation.      Erick Colace, M.D.  Electronically Signed     AEK/MedQ  D:  02/07/2007 16:09:46  T:  02/08/2007 12:57:25  Job #:  [id number]

## 2010-08-19 NOTE — Procedures (Signed)
NAMESHAKIR, PETROSINO                 ACCOUNT NO.:  [id number]   MEDICAL RECORD NO.:  [id number]         PATIENT TYPE:  aecp   LOCATION:                                 FACILITY:   PHYSICIAN:  Erick Colace, M.D.DATE OF BIRTH:  24-Oct-1960   DATE OF PROCEDURE:  DATE OF DISCHARGE:                               OPERATIVE REPORT   INDICATIONS:  Left trochanteric bursitis.   Left hip marked and prepped with Betadine after obtaining informed  consent where I described the risks and benefits of the procedure.  He  has given written consent.  Area marked prepped with Betadine, entered  with 25-gauge inch and a half needle, 1 mL of 40 mg Depo-Medrol plus 4  mL of 1% lidocaine were injected after negative drawback for blood.  The  patient tolerated the procedure well.  Post-injection instructions  given.  He has had pain that persists despite NSAIDs, and interferes  with ambulation.      Erick Colace, M.D.  Electronically Signed     AEK/MEDQ  D:  12/19/2007 15:30:53  T:  12/20/2007 05:21:35  Job:  [id number]

## 2010-08-19 NOTE — Assessment & Plan Note (Signed)
This is a followup visit.  The patient did well with procedure on  January 02, 2008.  Had an episode, where he hurt all over when the  weather changed.  This did not apply just to his back, but also to his  leg and hip area.  His average pain is down to 3/10 and interferes with  activity at a moderate level.  Pain is worse in the morning hours.  Relief from meds is good, but he does not tolerate any of the stronger  pain medications.  He is taking Mobic.  Even Ultram seems to give him  some problems in terms of drowsiness.   He has had no other new medical problems.  He is employed 40 hours a  week.   Oswestry disability index today 18%, putting him in the mild or minimal  disability range.   PHYSICAL EXAMINATION:  VITAL SIGNS:  His blood pressure is 124/71, pulse  54, respirations 18, and O2 sat 95% on room air.  GENERAL:  In no acute distress.  Orientation x3.  Affect is alert.  Gait  is normal.  EXTREMITIES:  Without edema.  Coordination normal.  He has good hip  internal and external rotation.  Normal lower extremity strength.  No  tenderness over the PSIS area.  His back range of motion is good.   IMPRESSION:  1. 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  2. Probable diffuse osteoarthritis, had had a flare-up, but now      improved.   We will have him continue on Mobic and given samples of Voltaren gel.   We discussed other options in terms of pain management including  prolotherapy of the SI joint as well as acupuncture.  He has tried  acupuncture and has had success in the past, may consider it again.      Erick Colace, M.D.  Electronically Signed     AEK/MedQ  D:  02/13/2008 11:31:59  T:  02/13/2008 22:59:46  Job #:  [id number]   cc:   Ollen Gross, M.D.  Fax: [phone]

## 2010-08-19 NOTE — Assessment & Plan Note (Signed)
HISTORY OF PRESENT ILLNESS:  Mr. Gunner returns today complaining  originally for Acupuncture, but also complaining of increasing pain in  the low back area.  He has pain mainly on the left side and the hip  area.  His head has no new trauma.  He has been able to work full time.  He has had no bowel or bladder dysfunction, and he has had no lower  extremity weakness or numbness.   PHYSICAL EXAMINATION:  EXTREMITIES:  Pain in the SI area.  Normal gait.  Pain score in the 3-4/10 range.   REVIEW OF CHART:  His last radiofrequency procedure was January 02, 2008, which is approximately 5 months ago.  This is likely starting to  wear off.  We will repeat procedure in one month.   I have discussed with the patient, and he is in agreement.      Erick Colace, M.D.  Electronically Signed     AEK/MedQ  D:  05/28/2008 16:36:21  T:  05/29/2008 04:57:30  Job #:  [id number]

## 2010-08-19 NOTE — Assessment & Plan Note (Signed)
Anthony [name] returns today.  He has undergone a right sacroiliac  radiofrequency ablation on July 25, 2007.  He underwent left side on  June 13, 2007.  He has had good relief of his sacroiliac-mediated pain.  He continues to have good recovery from his right total hip replacement  which was done in January 2009.  He is back to his usual activities  times a couple of months now.  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  He has had no postprocedural complications.  His main  complaint at this time is some hip pain, but this is more on the outer  quadrant of the buttock, left is greater than the right.  He is status  post right THA.   He does have some pain with prolonged sitting, but this is relieved,  combination of Mobic and occasional Tylenol.  He is receiving Mobic from  his primary care physician.   He has had no other medical complaints over the last couple of months.  His average pain is around 4/10.  He does have some posterior thigh  radiation at times with prolonged sitting.  He can walk 60 minutes at a  time.  He is employed 40 hours a week.  His review of systems is  otherwise negative.   PHYSICAL EXAMINATION:  VITAL SIGNS:  His blood pressure is 122/68, pulse  61, respiratory rate 18, and O2 sat 97% on room air.  GENERAL:  In no acute distress.  Mood and affect appropriate.  BACK:  Good range of motion, forward flexion, but mildly limited in  extension, but this is not accompanied by pain.  He has no pain over the  PSIS.  He does have some tenderness around the greater trochanter  bilaterally.  His lower extremity strength is normal.  Deep tendon  reflexes are normal.  Knee and ankle range of motion are normal.  Straight leg raising test is negative.  Gait is normal.  Extremities  without edema.   IMPRESSION:  1. 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  2. Bilateral hip enthesopathy versus trochanteric bursitis, left      greater than  right.  3. Status post right total hip arthroplasty.   PLAN:  At this point I have instructed Anthony [name] on tensor fascia lata  stretching exercises, also stretching of the external rotators of the  hip.   I instructed him that if this is not helpful over the next month or so,  to make an appointment to do a hip trochanteric bursa injection.   Otherwise, I will see him in about 5 months, which would put him about 8  months post radiofrequency ablation on the left side.  We will see how  he is doing and decide whether repeat radiofrequency ablation would be  needed or whether he can wait.      Erick Colace, M.D.  Electronically Signed     AEK/MedQ  D:  09/26/2007 08:39:49  T:  09/26/2007 22:18:35  Job #:  [id number]   cc:   Ollen Gross, M.D.  Fax: [phone]

## 2010-08-19 NOTE — Assessment & Plan Note (Signed)
PROCEDURE:  Acupuncture treatment.   INDICATION:  Pain related to postherpetic neuralgia, left shoulder  region.   Pain is only partially responsive to medication management.   DESCRIPTION OF PROCEDURE:  Needle was placed at SI 10, SJ 14, L1 14, S1  14, BL 11, BL 12, BL 13, BL 14, and forward stimulation x30 minutes.  The patient tolerated the procedure well.  Postprocedure instruction  given.      Erick Colace, M.D.  Electronically Signed     AEK/MedQ  D:  06/04/2008 17:19:59  T:  06/05/2008 62:95:28  Job #:  [id number]

## 2010-08-19 NOTE — Assessment & Plan Note (Signed)
DATE OF LAST VISIT:  June 13, 2007   Patient has done well with his left buttock pain status post left L4-L5  medial branch dorsal ramus injection and radiofrequency.  Also including  left S1, S2, S3 dorsal ramus RFA.  He has had no new problems, other  than he has now noticed the right-sided pain.  He is planning to go back  to work in a couple of weeks and is concerned that the right-sided pain  may inhibit his work activities.   He is independent with his self-care and mobility.  Prolonged sitting  does aggravate his left buttock pain.  His recovery from his hip surgery  is going well.  He has followed up with Dr. Lequita Halt.   His blood pressure is 126/73.  Pulse 76.  Respiratory rate 18.  O2 sat  is 96% room air.  GENERAL:  No acute distress.  Mood and affect appropriate.  His back has tenderness over the right PSIS, not over the left side.  He  has pain when coming up from a forward flexed position.  His lower  extremity strength is normal.  Deep tendon reflexes are normal.  Gait  shows some mild antalgia favoring the right lower extremity.   IMPRESSION:  Low back buttock pain improvement after left-sided L4  medial branch block RFA, L5 doral ramus injection RFA S1, S2, S3.  We  will schedule for the right side.      Erick Colace, M.D.  Electronically Signed     AEK/MedQ  D:  07/11/2007 08:37:27  T:  07/11/2007 09:14:36  Job #:  [id number]   cc:   Ollen Gross, M.D.  Fax: [phone]

## 2010-08-19 NOTE — Procedures (Signed)
Anthony [name], Anthony [name]                 ACCOUNT NO.:  [id number]   MEDICAL RECORD NO.:  [id number]          PATIENT TYPE:  REC   LOCATION:  [facility]                          FACILITY:  [facility]   PHYSICIAN:  Erick Colace, M.D.DATE OF BIRTH:  09/18/1960   DATE OF PROCEDURE:  08/20/2008  DATE OF DISCHARGE:                               OPERATIVE REPORT   A 50 year old male being treated with acupuncture for his left  infraspinatus pain and muscle dysfunction related to zoster as well as  his sacroiliac discomfort.  His pain is only partially responsive to  medication management and interferes with mobility.   Prior procedure was performed 1 week ago.   PROCEDURE:  Acupuncture needles 40-mm placed using the Daldry technique  left SI-11 and needles placed at bilateral BL 21, BL 27, left BL 23, BL  25, left BG 30, , left BL 51, left BL 52, left BL 53, 4 Hz stimulation  x30 minutes.  The patient tolerated the procedure well.  Postprocedure  instructions given.  I will see him back in 1-2 weeks for repeat  procedure.       Erick Colace, M.D.  Electronically Signed     AEK/MEDQ  D:  08/20/2008 15:42:10  T:  08/21/2008 05:50:15  Job:  [id number]

## 2010-08-19 NOTE — Assessment & Plan Note (Signed)
A 50 year old male who I last saw on February 13, 2008.  He has a history  of right hip OA status post TAH, 1 year ago today.  He has had  sacroiliac radiofrequency just prior to that.   He had a repeat procedure in March 2009 and in April.  March, on left  and April, on the right, and then underwent a left repeat in September.  He also underwent left troch bursa injection in September.   In terms of sacroiliac, he is doing quite well.  His main complaint is  twofold left hip which interferes with sleep.  This does not interfere  as much with walking and the other issue is new onset shingles which  also has been over the last month or so.  This involves shoulder down  his arm into his left thumb.   His average pain is 6/10, up to 8 at times, mainly in the shoulder  greater than the hip.  The hip hurts more at night.  He did see ER,  given Vicodin which he really cannot take during the day, makes him too  drowsy as well as Neurontin which makes him too drowsy during the day.  He has not tried Lidoderm for this, but has tried this in the past for  other conditions.   PHYSICAL EXAMINATION:  VITAL SIGNS:  His blood pressure is 131/99, pulse  79, respirations 18, O2 sat 97% room air.  GENERAL:  No acute stress.  Orientation x3.  Affect is alert.  Gait is  normal.  EXTREMITIES:  Without edema.  He does have some skin discoloration, but  no pustules in the left upper arm shoulder area and no lesions below the  elbow area.  He has hypersensitivity to touch over the C6 dermatome on  the left side only.   His motor strength is full in the upper extremities.  His left hip has  tenderness to palpation over the greater trochanter.  His lower  extremity strength is normal.  No pain over the left PSIS.  He has  normal range of motion in the lower extremities and normal deep tendon  reflexes.   IMPRESSION:  1. Left sacroiliac pain improved after sacroiliac radiofrequency.  2. Left trochanteric  bursitis, recurrent.  3. Shingles, postherpetic neuralgia, left C6 dermatome.   PLAN:  1. We will do left troch bursa injection.  2. We will write for Lidoderm for the postherpetic neuralgia.  3. Scheduled for acupuncture.   I will see him back for the acupuncture in 1 or 2 weeks.      Erick Colace, M.D.  Electronically Signed     AEK/MedQ  D:  04/30/2008 12:46:59  T:  05/01/2008 02:04:30  Job #:  [id number]

## 2010-08-19 NOTE — Assessment & Plan Note (Signed)
Mr. Anthony [name] returns today.  He has had a right sacroiliac ablation,  radiofrequency ablation July 25, 2007, and on the left side he had a  sacroiliac ablation on June 13, 2007.  He was having good relief with  his sacroiliac-mediated pain, however around 8 weeks ago had a Jones  fracture of his left fifth metatarsal and wore cast boot, which altered  his gait and afterwards started developing increased pain on the left  side.  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   He has had some exacerbation of his trochanteric bursitis pain.   PHYSICAL EXAMINATION:  GENERAL:  In no acute distress.  Mood and affect  appropriate.  His back has no tenderness to palpation except at the PSIS and in the  left hip.  His reflexes in the lower extremities is normal.  Sensation  is normal in the lower extremities.  His extremities are without edema.   IMPRESSION:  1. Sacroiliac disorder, improved initially after radiofrequency      ablation, now has recurrence on left side.  We will need to repeat.  2. Left hip trochanteric bursitis.  We will inject today.   I have discussed other deferential with the patient, but I do think that  this is certainly the etiology of his current pain.      Erick Colace, M.D.  Electronically Signed     AEK/MedQ  D:  12/19/2007 15:29:33  T:  12/20/2007 08:32:13  Job #:  [id number]

## 2010-08-19 NOTE — Discharge Summary (Signed)
NAMEEVIE, CRUMPLER                 ACCOUNT NO.:  [id number]   MEDICAL RECORD NO.:  [id number]          PATIENT TYPE:  INP   LOCATION:  1606                         FACILITY:  [facility]   PHYSICIAN:  Ollen Gross, M.D.    DATE OF BIRTH:  Oct 05, 1960   DATE OF ADMISSION:  05/02/2007  DATE OF DISCHARGE:  05/05/2007                               DISCHARGE SUMMARY   ADMISSION DIAGNOSES:  1. Osteoarthritis, right hip.  2. Peptic ulcer disease.  3. History of bronchitis.  4. Allergic rhinitis.  5. Degenerative disk disease.  6. Esophageal reflux.  7. Hiatal hernia.  8. Hyperlipidemia.  9. Insomnia.  10.Mild hypertension.  11.Irritable bowel syndrome.  12.Diverticulosis.   DISCHARGE DIAGNOSES:  1. Osteoarthritis, right hip, status post right total hip      arthroplasty.  2. Peptic ulcer disease.  3. History of bronchitis.  4. Allergic rhinitis.  5. Degenerative disk disease.  6. Esophageal reflux.  7. Hiatal hernia.  8. Hyperlipidemia.  9. Insomnia.  10.Mild hypertension.  11.Irritable bowel syndrome.  12.Diverticulosis.   PROCEDURE:  On May 02, 2007, right total hip.  Surgeon was Dr. Ollen Gross and assistant Alexzandrew L. Perkins, P.A.C.  Anesthesia was  general.   CONSULTATIONS:  None.   HISTORY:  Halley is a 50 year old male with end-stage osteoarthritis of the  right hip with progressive worsening pain and dysfunction, with raised  femoral head, raised bone on bone and loss some of the femoral head. He  had reached a point where he would benefit from a hip replacement.  The  risks and benefits were discussed.  The patient was subsequently  admitted to the hospital.   LABORATORY DATA:  Preop CBC showed a hemoglobin of 13 with a hematocrit  of 40.3, white cell count 7.3, platelets 343.  Serial CBCs were  followed.  Hemoglobin did drop down to a 11.  The last H&H 11.4 and 33  respectively.  PT and PTT preoperatively 132.6 and 33 respectively.  INR  normal at 0.6.   Serial pro-times followed with PT 120.2 and PTT 1.7.  Chemistry panel on admission all within normal limits.  Serial  electrolytes remained within normal limits except for the glucose of 101  to 125, back down to 101.  Preoperative urinalysis negative.  Blood type  O-positive.   Electrocardiogram on April 27, 2007:  Normal sinus rhythm.  Normal  electrocardiogram, confirmed by Dr. Eduardo Osier. Harwani.   A 2-view chest on April 27, 2007:  No acute findings.  Hip films on  April 27, 2007:  Severe right hip osteoarthritis.  Pelvis film on  May 02, 2007:  Status post right hip prosthesis.  No visible saw  blade, surgical clips, likely related to prior vasectomy.   HOSPITAL COURSE:  The patient was admitted to [facility] and  tolerated the procedure well.  Later returned to the recovery room and  then to the orthopedic floor.  Started on PCA for pain control following  surgery and doing well on the evening of surgery and the morning of  postop day one.  Started getting up with PT.  Weaned off of pain  medications.  Started back on medications.  Hemoglobin was 11.3.  Had  excellent urinary output.  By day two was doing well except for some  fair amount of spasms.  Ambulating well up to 115 feet.  Dressing was  changed.  The incision looked good.  No signs of infection.  Hemoglobin  was 11.  By day three the patient was doing well.  He was tolerating  medications with admission goals of therapy.   DISPOSITION:  Discharged home on May 05, 2007.   DISCHARGE MEDICATIONS:  1. Coumadin.  2. Percocet.  3. Robaxin.   ACTIVITY:  Partial weightbearing of 25% to 50% on right lower extremity.   FOLLOWUP:  Follow up in two weeks.   CONDITION ON DISCHARGE:  Improved.      Alexzandrew L. Perkins, P.A.C.      Ollen Gross, M.D.  Electronically Signed    ALP/MEDQ  D:  06/07/2007  T:  06/07/2007  Job:  [id number]   cc:   Loyal Jacobson, M.D.  [facility]  [address] [place], [place] [address]

## 2010-08-19 NOTE — Procedures (Signed)
[name], Anthony [name]                 ACCOUNT NO.:  [id number]   MEDICAL RECORD NO.:  [id number]          PATIENT TYPE:  REC   LOCATION:  TPC                          FACILITY:  [facility]   PHYSICIAN:  Erick Colace, M.D.DATE OF BIRTH:  Apr 20, 1960   DATE OF PROCEDURE:  DATE OF DISCHARGE:                               OPERATIVE REPORT   A 50 year old male who I last saw on April 30, 2008.  He is here for  acupuncture treatment of left upper extremity pain following with  diagnosis of postherpetic neuralgia.   Needles placed on the left at SJ 14, LI at the large intestine 11, SI at  the small intestine 11, and SI as in the small intestine 13, as well as  BL 11, BL as in bladder 12, BL as in bladder 13, BL as in bladder 14.  The patient tolerated 4 Hz stimulation x45 minutes.  The patient  tolerated the procedure well.      Erick Colace, M.D.  Electronically Signed     AEK/MEDQ  D:  05/21/2008 16:08:33  T:  05/22/2008 01:59:40  Job:  [id number]

## 2010-08-19 NOTE — Procedures (Signed)
NAMESULEIMAN, FINIGAN                 ACCOUNT NO.:  [id number]   MEDICAL RECORD NO.:  [id number]           PATIENT TYPE:   LOCATION:                                 FACILITY:   PHYSICIAN:  Erick Colace, M.D.DATE OF BIRTH:  1961/01/06   DATE OF PROCEDURE:  DATE OF DISCHARGE:                               OPERATIVE REPORT   PROCEDURE:  Left trochanteric bursa injection.   INDICATIONS:  Left trochanteric bursitis only partial response to  medication management and other conservative care.   Mr. Runkles follows up today.  He has had a recurrence of trochanteric  bursitis, which persists despite nonsteroidal anti-inflammatory, as well  as stretching.   Informed consent was obtained after describing risks and benefits of the  procedure with the patient.  These include bleeding, bruising,  infection, he elects to proceed and has given written consent.  The  patient placed in the right lateral decubitus position.  Area marked,  prepped with Betadine, alcohol entered with 25-gauge inch and half  needle, 1 mL of 40 mg/mL Depo-Medrol, and 4 mL of 1% MPF lidocaine  injected.  The patient tolerated the procedure well.  Injection done  after negative drawback for blood.  Post injection instructions given.      Erick Colace, M.D.  Electronically Signed     AEK/MEDQ  D:  04/30/2008 12:41:54  T:  05/01/2008 01:54:10  Job:  [id number]

## 2010-08-19 NOTE — Procedures (Signed)
NAMEJAYLEE, Anthony [name]                 ACCOUNT NO.:  [id number]   MEDICAL RECORD NO.:  [id number]          PATIENT TYPE:  REC   LOCATION:  TPC                          FACILITY:  [facility]   PHYSICIAN:  Erick Colace, M.D.DATE OF BIRTH:  03-22-1961   DATE OF PROCEDURE:  02/28/2007  DATE OF DISCHARGE:                               OPERATIVE REPORT   PROCEDURE:  Left L5 dorsal ramus injection, left L4 medial branch block,  left L3 medial branch block under fluoroscopic guidance.   INDICATIONS:  Lumbar axial pain, left-sided primarily, only partially  responsive to medication management.   Informed consent was obtained after describing the risks and benefits of  the procedure to the patient.  These include bleeding, bruising,  infection, loss of bowel or bladder function, temporary or permanent  paralysis.  He elects to proceed and has given written consent.   The patient placed prone on fluoroscopy table.  Betadine prep and  sterile drape.  A 25-gauge 1-1/2-inch needle was used to anesthetize the  skin and subcu tissue with 1% lidocaine x2 mL, and then a 22-gauge 3-1/2-  inch spinal needle was inserted under fluoroscopic guidance, targeting  the left S1 SAP sacral ala junction.  Bone contact made and confirmed  with lateral imaging.  Omnipaque 180 x 0.5 mL demonstrated no  intravascular uptake and appropriate spread.  Then a solution containing  1 mL of 4 mg/mL dexamethasone and 2 mL of 2% MPF lidocaine was injected  x 0.75 mL.  Then the left L5 SAP transverse process junction targeted.  Bone contact made and confirmed with lateral imaging.  Omnipaque 180 x  0.5 mL demonstrated no intravascular uptake, and 0.75 mL of the  dexamethasone/lidocaine solution was injected and the left L4 SAP  transverse process junction targeted.  Bone contact made and confirmed  with lateral imaging.  Omnipaque 180 x 0.5 mL demonstrated no  intravascular uptake, and then 0.75 mL of  dexamethasone/lidocaine  solution was injected.   The patient tolerated the procedure well.  Pre and post-injection vitals  stable.      Erick Colace, M.D.  Electronically Signed     AEK/MEDQ  D:  02/28/2007 10:19:14  T:  02/28/2007 11:34:35  Job:  [id number]

## 2010-08-19 NOTE — Procedures (Signed)
Anthony [name], Anthony [name]                 ACCOUNT NO.:  [id number]   MEDICAL RECORD NO.:  [id number]          PATIENT TYPE:  REC   LOCATION:  TPC                          FACILITY:  [facility]   PHYSICIAN:  Erick Colace, M.D.DATE OF BIRTH:  Jun 16, 1960   DATE OF PROCEDURE:  DATE OF DISCHARGE:                               OPERATIVE REPORT   PROCEDURE:  Left L5 dorsal ramus injection, left S1, S2, and S3 dorsal  ramus neurolysis with radiofrequency.   INDICATIONS:  Sacroiliac disorder, prior relief with radiofrequency  ablation and neurolysis, and radiofrequency neurotomy.   Informed consent was obtained after describing risks and benefits of the  procedure with the patient.  These include bleeding, bruising,  infection, and temporary or permanent paralysis.  He elects to proceed  and has given written consent.  The patient was placed prone on  fluoroscopy table.  Betadine prep, sterile drape.  A 25-gauge 1-1/2 inch  needle was used to anesthetize the skin and subcu tissue, 1% lidocaine  x2 mL.  Then, a 20-gauge 10-cm RF needle with 10 mm curved active tip  was inserted under fluoroscopic guidance.  First starting left S1 SAP  sacral ala junction.  Bone contact made confirmed with lateral imaging.  Sensory stem at 50 Hz followed by motor stem at 2 Hz confirmed proper  needle location and followed by injection of 1 mL of solution containing  1 mL of 4 mL dexamethasone and 4 mL 1% MPF lidocaine followed by  radiofrequency lesioning at 70 degrees Celsius for 70 seconds.  In the  left S1 dorsal ramus lateral aspect targeted bone contact was made.  Sensory stem at 50 Hz confirmed proper needle location followed by  injection 1 mL of dexamethasone, lidocaine solution, and radiofrequency  lesioning at 70 degrees Celsius for 70 seconds.  Then,  left S2 sacral  foramen targeted bone contact made.  Sensory stem at 50 Hz confirmed  proper needle location followed by injection of 1 mL of  dexamethasone,  lidocaine solution, and radiofrequency lesioning at 70 degrees Celsius  for 70 seconds.  Then, the left S3 lateral aspect of the foramen was  targeted bone contact made.  Sensory stem at 50 Hz confirmed proper  needle location followed by injection of 1 mL of dexamethasone,  lidocaine solution, and radiofrequency lesioning at 70 degrees x 70  seconds.  The patient tolerated the procedure well.  Postinjection  instructions were given.  Return in 1 month for followup.  He may return  to work tomorrow.      Erick Colace, M.D.  Electronically Signed     AEK/MEDQ  D:  01/02/2008 15:43:21  T:  01/03/2008 04:30:15  Job:  [id number]

## 2010-08-19 NOTE — Assessment & Plan Note (Signed)
Anthony [name] follows up today.  He had bilateral sacral iliac injection  with fluoroscopic guidance on April 25, 2007.  He had a good relief  for about 3 to 4 days afterwards, but pain did recur.  He had a right  hip replacement since I last saw him, and he is about 5 weeks post.  He  asked for earlier followup because of persistent left buttocks pain,  which feels like his sacroiliac to him.  From a hip standpoint, he has  really done quite well postoperatively.  He no longer uses a cane.  He  is able to do all of his rehab on his own.  He is walking his dog again.   He can walk 10 minutes at a time.  He is not working, but needs to get  back to work in about another 6 to 8 weeks or so.   His left hip or left buttocks pain interferes with getting up and down.  Also interferes with ambulation.  He is concerned that this is going to  interfere with his work activities.   EXAMINATION:  GENERAL:  In no acute distress.  Mood and affect  appropriate.  BACK:  No tenderness to palpation.  His right hip incision is nicely healed.  His gait is mildly wide-based.  No evidence of toe drag or knee instability.  He is able to lie in the  prone position.  He is able to also lie supine and has a positive  FABER's maneuver referring pain to the left PSIS area.   IMPRESSION:  1. Left sacroiliac arthropathy.  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  2. Right hip osteoarthritis status post total hip arthroplasty,      healing well.  Following up with Dr. Lequita Halt on this.  He is using      minimal medications, some oxycodone mainly at night.   PLAN:  Will set him up for a sacroiliac radiofrequency procedure on the  left side.  I told him that, timing-wise, it takes about a month before  we effect this.  Will go ahead and get him scheduled in the next week or   2.      Erick Colace, M.D.  Electronically Signed     AEK/MedQ  D:  06/02/2007 09:34:52  T:  06/02/2007 15:27:32  Job #:  [id number]   cc:   Ollen Gross, M.D.  Fax: [phone]

## 2010-08-19 NOTE — Op Note (Signed)
NAMEFERRELL, CLAIBORNE                 ACCOUNT NO.:  [id number]   MEDICAL RECORD NO.:  [id number]          PATIENT TYPE:  INP   LOCATION:  1606                         FACILITY:  [facility]   PHYSICIAN:  Ollen Gross, M.D.    DATE OF BIRTH:  11-21-1960   DATE OF PROCEDURE:  05/02/2007  DATE OF DISCHARGE:                               OPERATIVE REPORT   PREOPERATIVE DIAGNOSIS:  Osteoarthritis right hip.   POSTOPERATIVE DIAGNOSIS:  Osteoarthritis right hip.   PROCEDURE:  Right total hip arthroplasty.   SURGEON:  Ollen Gross, M.D.   ASSISTANT:  Avel Peace PA-C   ANESTHESIA:  General.   BLOOD LOSS:  250 mL.   DRAINS:  Hemovac times one.   COMPLICATIONS:  None.   CONDITION:  Stable to recovery room.   BRIEF CLINICAL NOTE:  Timoth is a 50 year old male end-stage arthritis of  the right hip with progressively worsening pain and dysfunction.  He  eroded away his femoral head where he is bone on bone with loss of some  of the bone in the femoral head.  He presents now for total hip  arthroplasty due to intractable pain.   PROCEDURE IN DETAIL:  After successful administration of general  anesthetic, the patient is placed in left lateral decubitus position  with the right side up and held with the hip positioner.  Right lower  extremity was isolated from his perineum with plastic drapes and prepped  and draped in the usual sterile fashion.  The short posterolateral  incision is made with a 10 blade through subcutaneous tissue to the  level of fascia lata which was incised in line with the skin incision.  Sciatic nerve was palpated, protected and short external rotators  isolated off the femur.  Capsulectomy is performed and the hip was  dislocated.  The center of femoral head is marked and a trial prosthesis  placed such that the center of the trial head corresponds to the center  of native femoral head.  Osteotomy line is marked on the femoral neck  and osteotomy made with an  oscillating saw.  Femoral head removed and  the femur was retracted anteriorly to gain acetabular exposure.   Acetabular retractors were placed and labrum and osteophytes removed.  The acetabular reaming starts at 45 mm coursing increments of 2 to 53 mm  and then a 54 mm Pinnacle acetabular shell is placed in anatomic  position and transfixed with two dome screws.  The permanent apex hole  eliminator is placed and the 36 mm neutral Ultamet metal liner was  placed.  This is a metal-on-metal hip replacement.  On the femoral side  we performed axial reaming up to 15.5 mm, proximal reaming to a 20 F and  the sleeve is machined to a large.  20 F large trial sleeve is placed  with a 20 x 15 stem and a 36 plus 12 neck.  He is a retroverted about 15  degrees with his native anatomy.  I anteverted him about 15-20 degrees  in order to get to the most stable position.  A  trial 36 +0 head is  placed.  Hip is reduced with excellent stability.  There is full  extension, full external rotation, 70 degrees flexion, 40 degrees  abduction and 80 degrees of internal rotation and 90 degrees flexion, 70  degrees internal rotation.  By placing the right leg on top of the left,  it felt as though leg lengths were equal.  The hip was then dislocated  and trials removed.  The permanent 20 F large sleeve is placed with 20 x  15 stem and 36 +12 neck in about 15-20 degrees anteversion.  The  permanent 36.0 head is placed and the hip is reduced the same stability  parameters.  The wound is copiously irrigated with saline solution and  short rotators reattached to the femur through drill holes.  Fascia was  closed over Hemovac drain with interrupted #1 Vicryl, subcu closed with  #1-0 and #2-0 Vicryl subcuticular running 4-0 Monocryl.  The drain is  hooked to suction, the incision is cleaned and dried and Steri-Strips  and a bulky sterile dressing are applied.  She was then placed into a  knee immobilizer, awakened and  transported to recovery in stable  condition.      Ollen Gross, M.D.  Electronically Signed     FA/MEDQ  D:  05/02/2007  T:  05/03/2007  Job:  [id number]

## 2010-08-19 NOTE — Procedures (Signed)
[name], Anthony [name]                 ACCOUNT NO.:  [id number]   MEDICAL RECORD NO.:  [id number]          PATIENT TYPE:  REC   LOCATION:  TPC                          FACILITY:  [facility]   PHYSICIAN:  Erick Colace, M.D.DATE OF BIRTH:  [birth year]   DATE OF PROCEDURE:  DATE OF DISCHARGE:                               OPERATIVE REPORT   PROCEDURE:  Acupuncture with electrical stimulation.   INDICATIONS:  Sacroiliac disorder as well as infraspinatus myofascial  pain.   Pain is only partially responsive to medication management and other  conservative care and encouraged mobility and self-care.   The patient placed prone on exam table.  Acupuncture needles placed.  Baldry technique, left SI 11, as well as needles placed at bilateral BL  21, BL 27, as well as left BL 23, BL 25, left GB 30, left BL 51, left BL  52, left BL 53.  A 4 Hz stimulation x30 minutes.  The patient tolerated  the procedure well.  Postprocedure instructions given.      Erick Colace, M.D.  Electronically Signed     AEK/MEDQ  D:  08/13/2008 14:41:33  T:  08/14/2008 02:38:47  Job:  [id number]

## 2010-08-22 NOTE — Assessment & Plan Note (Signed)
The patient was last seen by me March 21, 2007 at which time he had a  bilateral L5 dorsal ramus injection, bilateral L4 medial branch block,  bilateral L3 medial branch block under fluoroscopic guidance.  He had  good temporary relief with medial branch blocks.  His pain is lower down  now.  Paramedian bilateral.  No lower extremity radicular discomfort.  His pain is fairly symmetric.  His pain is 5 to 6 out of 10 level.  Continues to be employed 50+ hours a week at the [employer].  He  is aggravated by inactivity and sitting.  Improves with exercise,  medication, and injections.   INTERVAL MEDICAL HISTORY:  He is planning to undergo hip replacement per  Dr. Lequita Halt later this month.   His blood pressure is 129/66, pulse is 75, respirations are 18 and O2  sat 95% on room air.  GENERAL:  No acute distress.  Examination with tenderness to PSIS bilaterally and referring to the  PSIS with FABER's testing.  He has extremely limited hip internal range  of motion, essentially nil on the right side and 25% normal on the left  side.  His pain with extension of his lumbar spine is mild to moderate.  Forward flexion is full range without pain.  Lower extremity strength is  good.  Deep tendon reflexes are normal in the lower extremities.   IMPRESSION:  1. Lumbar axial pain.  He has had temporary relief with medial branch      blocks indicating facet arthropathy.  There is some shared      innervation with an L5 dorsal ramus, L4 medial branch with the      sacroiliac joint as well.  Given that his pain has become more      noticeable lower down, we will due a sacroiliac injection which      should also help with his inactivity following hip replacement      surgery.  2. I will see him back for the sacroiliac injections, and then we will      have to wait until after recovering from his hip surgery to decide      whether or not any radiofrequency procedures would be of  benefit.      Erick Colace, M.D.  Electronically Signed     AEK/MedQ  D:  04/11/2007 16:16:10  T:  04/11/2007 18:11:16  Job #:  [id number]   cc:   Ollen Gross, M.D.  Fax: [phone]

## 2010-12-26 LAB — CBC
HCT: 33.2 — ABNORMAL LOW
HCT: 40.3
Hemoglobin: 11.3 — ABNORMAL LOW
MCHC: 33.9
MCHC: 34
MCV: 87.8
MCV: 88.1
Platelets: 228
RBC: 3.77 — ABNORMAL LOW
RBC: 4.58
RDW: 14.7
RDW: 14.9
RDW: 14.9
RDW: 15.3
WBC: 7.3
WBC: 8
WBC: 9.5

## 2010-12-26 LAB — BASIC METABOLIC PANEL
CO2: 30
CO2: 31
Calcium: 8.2 — ABNORMAL LOW
Creatinine, Ser: 0.71
GFR calc Af Amer: >60
GFR calc non Af Amer: >60
Glucose, Bld: 101 — ABNORMAL HIGH
Glucose, Bld: 125 — ABNORMAL HIGH
Potassium: 4.7
Sodium: 138

## 2010-12-26 LAB — COMPREHENSIVE METABOLIC PANEL
AST: 25
Albumin: 3.8
BUN: 19
CO2: 32
GFR calc Af Amer: >60
Glucose, Bld: 101 — ABNORMAL HIGH
Sodium: 143
Total Bilirubin: 0.7

## 2010-12-26 LAB — URINALYSIS, ROUTINE W REFLEX MICROSCOPIC
Hgb urine dipstick: NEGATIVE
Urobilinogen, UA: 0.2
pH: 6

## 2010-12-26 LAB — PROTIME-INR
INR: 1.4
INR: 1.7 — ABNORMAL HIGH

## 2010-12-26 LAB — APTT: aPTT: 33

## 2010-12-26 LAB — TYPE AND SCREEN: Antibody Screen: NEGATIVE

## 2011-02-16 ENCOUNTER — Encounter: Payer: Federal, State, Local not specified - PPO | Attending: Physical Medicine & Rehabilitation

## 2011-02-16 ENCOUNTER — Ambulatory Visit (HOSPITAL_BASED_OUTPATIENT_CLINIC_OR_DEPARTMENT_OTHER): Payer: Federal, State, Local not specified - PPO | Admitting: Physical Medicine & Rehabilitation

## 2011-02-16 DIAGNOSIS — G8928 Other chronic postprocedural pain: Secondary | ICD-10-CM

## 2011-02-16 DIAGNOSIS — M533 Sacrococcygeal disorders, not elsewhere classified: Secondary | ICD-10-CM | POA: Insufficient documentation

## 2011-02-16 NOTE — Procedures (Signed)
Anthony [name], Anthony [name]                 ACCOUNT NO.:  [id number]  MEDICAL RECORD NO.:  [id number]           PATIENT TYPE:  O  LOCATION:  TPC                          FACILITY:  [facility]  PHYSICIAN:  Erick Colace, M.D.DATE OF BIRTH:  [birth year]  DATE OF PROCEDURE: DATE OF DISCHARGE:                              OPERATIVE REPORT  PROCEDURE:  Left sacroiliac left lumbar sacral radiofrequency neurotomy.  INDICATION:  Sacroiliac pain, chronic postoperative pain.  Informed consent was obtained after describing risks and benefits of the procedure with the patient.  These include bleeding, bruising, and infection.  He elects to proceed and has given written consent.  The patient placed prone on fluoroscopy table.  Betadine prep, sterile drape, a 25-gauge inch and half needle was used to anesthetize the skin and subcu tissue, 1% lidocaine x2 mL.  Then, a 20-gauge 10 cm RF needle with 10 mm curved active tip was inserted first targeting the left L5 SAP sacral ala junction, was targeted bone contact made, confirmed with lateral imaging.  Sensory stim at 50 Hz followed by motor stim at 2 Hz, confirmed proper needle location, followed by radiofrequency lesioning of both the needles inserted above 80 degrees for 90 seconds  after the area was anesthetized with 1 mL of solution containing 1 mL of 4 mg/mL dexamethasone and 4 mL of 1% MPF lidocaine.  Then, the left S1, S2, S3 lateral aspect of foramen targeted.  RF needle was placed just lateral to the foramen.  With needle tip approximated bone, sensory stim at 50 Hz followed, followed by motor stim at 2 Hz, confirmed proper needle location, followed by injection 1 mL of the dexamethasone lidocaine solution and radiofrequency lesioning, 80 degree Celsius for 90 seconds at each of the 3 needles.  The patient tolerated procedure well. Postprocedure instructions given.     Erick Colace, M.D. Electronically Signed    AEK/MEDQ  D:   02/16/2011 14:11:31  T:  02/16/2011 20:07:02  Job:  [id number]

## 2012-12-26 ENCOUNTER — Telehealth: Payer: Self-pay | Admitting: Physical Medicine & Rehabilitation

## 2012-12-26 NOTE — Telephone Encounter (Signed)
Yes may schedule for left sacroiliac radiofrequency 45 minute visit

## 2012-12-26 NOTE — Telephone Encounter (Signed)
Patient called requesting an RF, will this be ok to schedule?  Thank you.

## 2013-01-04 HISTORY — PX: APPENDECTOMY: SHX54

## 2013-01-17 ENCOUNTER — Ambulatory Visit: Payer: Federal, State, Local not specified - PPO | Admitting: Physical Medicine & Rehabilitation

## 2013-01-26 ENCOUNTER — Ambulatory Visit: Payer: Federal, State, Local not specified - PPO | Admitting: Physical Medicine & Rehabilitation

## 2013-05-31 HISTORY — PX: SPINAL FUSION: SHX223

## 2013-07-04 NOTE — Progress Notes (Signed)
Surgery on 07/27/13.  Need orders in EPIC.  Thank You.  

## 2013-07-06 ENCOUNTER — Other Ambulatory Visit: Payer: Self-pay | Admitting: Orthopedic Surgery

## 2013-07-18 ENCOUNTER — Encounter (HOSPITAL_COMMUNITY): Payer: Self-pay | Admitting: Pharmacy Technician

## 2013-07-18 NOTE — Patient Instructions (Addendum)
18 Anthony [name]  07/18/2013   Your procedure is scheduled on: Thursday April 23rd, 2015  Report to [facility] at 1130 AM.  Call this number if you have problems the morning of surgery [phone]   Remember:  Do not eat food  :After Midnight.              Clear liquids midnight until 0800 am day of surgery, then nothing by mouth.     Take these medicines the morning of surgery with A SIP OF WATER: omeprazole                               You may not have any metal on your body including hair pins and piercings  Do not wear jewelry, make-up, lotions, powders, or deodorant.   Men may shave face and neck.  Do not bring valuables to the hospital. [facility] IS NOT RESPONSIBLE FOR VALUABLES.  Contacts, dentures or bridgework may not be worn into surgery.  Leave suitcase in the car. After surgery it may be brought to your room.  For patients admitted to the hospital, checkout time is 11:00 AM the day of discharge.   Patients discharged the day of surgery will not be allowed to drive home.  Name and phone number of your driver: carla collins cell [phone]  Special Instructions: N/A  [facility] [place] - Preparing for Surgery Before surgery, you can play an important role.  Because skin is not sterile, your skin needs to be as free of germs as possible.  You can reduce the number of germs on your skin by washing with CHG (chlorahexidine gluconate) soap before surgery.  CHG is an antiseptic cleaner which kills germs and bonds with the skin to continue killing germs even after washing. Please DO NOT use if you have an allergy to CHG or antibacterial soaps.  If your skin becomes reddened/irritated stop using the CHG and inform your nurse when you arrive at Short Stay. Do not shave (including legs and underarms) for at least 48 hours prior to the first CHG shower.  You may shave your face. Please follow these instructions carefully:  1.  Shower with CHG Soap the night before  surgery and the  morning of Surgery.  2.  If you choose to wash your hair, wash your hair first as usual with your  normal  shampoo.  3.  After you shampoo, rinse your hair and body thoroughly to remove the  shampoo.                           4.  Use CHG as you would any other liquid soap.  You can apply chg directly  to the skin and wash                       Gently with a scrungie or clean washcloth.  5.  Apply the CHG Soap to your body ONLY FROM THE NECK DOWN.   Do not use on open                           Wound or open sores. Avoid contact with eyes, ears mouth and genitals (private parts).  Genitals (private parts) with your normal soap.             6.  Wash thoroughly, paying special attention to the area where your surgery  will be performed.  7.  Thoroughly rinse your body with warm water from the neck down.  8.  DO NOT shower/wash with your normal soap after using and rinsing off  the CHG Soap.                9.  Pat yourself dry with a clean towel.            10.  Wear clean pajamas.            11.  Place clean sheets on your bed the night of your first shower and do not  sleep with pets. Day of Surgery : Do not apply any lotions/deodorants the morning of surgery.  Please wear clean clothes to the hospital/surgery center.  FAILURE TO FOLLOW THESE INSTRUCTIONS MAY RESULT IN THE CANCELLATION OF YOUR SURGERY PATIENT SIGNATURE_________________________________  NURSE SIGNATURE__________________________________    CLEAR LIQUID DIET   Foods Allowed                                                                     Foods Excluded  Coffee and tea, regular and decaf                             liquids that you cannot  Plain Jell-O in any flavor                                             see through such as: Fruit ices (not with fruit pulp)                                     milk, soups, orange juice  Iced Popsicles                                    All solid  food Carbonated beverages, regular and diet                                    Cranberry, grape and apple juices Sports drinks like Gatorade Lightly seasoned clear broth or consume(fat free) Sugar, honey syrup  Sample Menu Breakfast                                Lunch                                     Supper Cranberry juice                    Beef broth  Chicken broth Jell-O                                     Grape juice                           Apple juice Coffee or tea                        Jell-O                                      Popsicle                                                Coffee or tea                        Coffee or tea Incentive Spirometer  An incentive spirometer is a tool that can help keep your lungs clear and active. This tool measures how well you are filling your lungs with each breath. Taking long deep breaths may help reverse or decrease the chance of developing breathing (pulmonary) problems (especially infection) following:  A long period of time when you are unable to move or be active. BEFORE THE PROCEDURE   If the spirometer includes an indicator to show your best effort, your nurse or respiratory therapist will set it to a desired goal.  If possible, sit up straight or lean slightly forward. Try not to slouch.  Hold the incentive spirometer in an upright position. INSTRUCTIONS FOR USE  1. Sit on the edge of your bed if possible, or sit up as far as you can in bed or on a chair. 2. Hold the incentive spirometer in an upright position. 3. Breathe out normally. 4. Place the mouthpiece in your mouth and seal your lips tightly around it. 5. Breathe in slowly and as deeply as possible, raising the piston or the ball toward the top of the column. 6. Hold your breath for 3-5 seconds or for as long as possible. Allow the piston or ball to fall to the bottom of the column. 7. Remove the mouthpiece from your mouth and breathe  out normally. 8. Rest for a few seconds and repeat Steps 1 through 7 at least 10 times every 1-2 hours when you are awake. Take your time and take a few normal breaths between deep breaths. 9. The spirometer may include an indicator to show your best effort. Use the indicator as a goal to work toward during each repetition. 10. After each set of 10 deep breaths, practice coughing to be sure your lungs are clear. If you have an incision (the cut made at the time of surgery), support your incision when coughing by placing a pillow or rolled up towels firmly against it. Once you are able to get out of bed, walk around indoors and cough well. You may stop using the incentive spirometer when instructed by your caregiver.  RISKS AND COMPLICATIONS  Take your time so you do not get dizzy or light-headed.  If you are in pain, you may need to take or  ask for pain medication before doing incentive spirometry. It is harder to take a deep breath if you are having pain. AFTER USE  Rest and breathe slowly and easily.  It can be helpful to keep track of a log of your progress. Your caregiver can provide you with a simple table to help with this. If you are using the spirometer at home, follow these instructions: SEEK MEDICAL CARE IF:   You are having difficultly using the spirometer.  You have trouble using the spirometer as often as instructed.  Your pain medication is not giving enough relief while using the spirometer.  You develop fever of 100.5 F (38.1 C) or higher. SEEK IMMEDIATE MEDICAL CARE IF:   You cough up bloody sputum that had not been present before.  You develop fever of 102 F (38.9 C) or greater.  You develop worsening pain at or near the incision site. MAKE SURE YOU:   Understand these instructions.  Will watch your condition.  Will get help right away if you are not doing well or get worse. Document Released: 08/03/2006 Document Revised: 06/15/2011 Document Reviewed:  10/04/2006 [facility] Patient Information 2014 [place], [place].

## 2013-07-20 ENCOUNTER — Encounter (HOSPITAL_COMMUNITY): Payer: Self-pay

## 2013-07-20 ENCOUNTER — Encounter (HOSPITAL_COMMUNITY)
Admission: RE | Admit: 2013-07-20 | Discharge: 2013-07-20 | Disposition: A | Discharge status: Home / Self Care | Payer: Federal, State, Local not specified - PPO | Source: Ambulatory Visit | Attending: Orthopedic Surgery | Admitting: Orthopedic Surgery

## 2013-07-20 ENCOUNTER — Ambulatory Visit (HOSPITAL_COMMUNITY)
Admission: RE | Admit: 2013-07-20 | Discharge: 2013-07-20 | Disposition: A | Discharge status: Home / Self Care | Payer: Federal, State, Local not specified - PPO | Source: Ambulatory Visit | Attending: Anesthesiology | Admitting: Anesthesiology

## 2013-07-20 DIAGNOSIS — Z01812 Encounter for preprocedural laboratory examination: Secondary | ICD-10-CM | POA: Insufficient documentation

## 2013-07-20 DIAGNOSIS — Z01818 Encounter for other preprocedural examination: Secondary | ICD-10-CM | POA: Insufficient documentation

## 2013-07-20 HISTORY — DX: Nausea with vomiting, unspecified: Z98.890

## 2013-07-20 HISTORY — DX: Gastro-esophageal reflux disease without esophagitis: K21.9

## 2013-07-20 HISTORY — DX: Essential (primary) hypertension: I10

## 2013-07-20 HISTORY — DX: Nausea with vomiting, unspecified: R11.2

## 2013-07-20 LAB — BASIC METABOLIC PANEL
BUN: 18 mg/dL (ref 6–23)
CALCIUM: 9.5 mg/dL (ref 8.4–10.5)
CHLORIDE: 103 meq/L (ref 96–112)
CO2: 28 mEq/L (ref 19–32)
CREATININE: 0.86 mg/dL (ref 0.50–1.35)
GFR calc Af Amer: >90 mL/min (ref >90)
GFR calc non Af Amer: >90 mL/min (ref >90)
Glucose, Bld: 92 mg/dL (ref 70–99)
Potassium: 4.5 mEq/L (ref 3.7–5.3)
Sodium: 141 mEq/L (ref 137–147)

## 2013-07-20 LAB — CBC
HEMATOCRIT: 46.1 % (ref 39.0–52.0)
Hemoglobin: 14.9 g/dL (ref 13.0–17.0)
MCH: 28 pg (ref 26.0–34.0)
MCHC: 32.3 g/dL (ref 30.0–36.0)
MCV: 86.5 fL (ref 78.0–100.0)
Platelets: 248 10*3/uL (ref 150–400)
RBC: 5.33 MIL/uL (ref 4.22–5.81)
RDW: 15.9 % — ABNORMAL HIGH (ref 11.5–15.5)
WBC: 6.5 10*3/uL (ref 4.0–10.5)

## 2013-07-20 NOTE — Progress Notes (Signed)
ekg 06-04-13 [facility] on chart

## 2013-07-24 ENCOUNTER — Emergency Department (HOSPITAL_BASED_OUTPATIENT_CLINIC_OR_DEPARTMENT_OTHER): Payer: Federal, State, Local not specified - PPO

## 2013-07-24 ENCOUNTER — Encounter (HOSPITAL_BASED_OUTPATIENT_CLINIC_OR_DEPARTMENT_OTHER): Payer: Self-pay | Admitting: Emergency Medicine

## 2013-07-24 ENCOUNTER — Emergency Department (HOSPITAL_BASED_OUTPATIENT_CLINIC_OR_DEPARTMENT_OTHER)
Admission: EM | Admit: 2013-07-24 | Discharge: 2013-07-24 | Disposition: A | Discharge status: Home / Self Care | Payer: Federal, State, Local not specified - PPO | Attending: Emergency Medicine | Admitting: Emergency Medicine

## 2013-07-24 DIAGNOSIS — Y9289 Other specified places as the place of occurrence of the external cause: Secondary | ICD-10-CM | POA: Insufficient documentation

## 2013-07-24 DIAGNOSIS — S2239XA Fracture of one rib, unspecified side, initial encounter for closed fracture: Secondary | ICD-10-CM

## 2013-07-24 DIAGNOSIS — K219 Gastro-esophageal reflux disease without esophagitis: Secondary | ICD-10-CM | POA: Insufficient documentation

## 2013-07-24 DIAGNOSIS — S2231XA Fracture of one rib, right side, initial encounter for closed fracture: Secondary | ICD-10-CM

## 2013-07-24 DIAGNOSIS — Z79899 Other long term (current) drug therapy: Secondary | ICD-10-CM | POA: Insufficient documentation

## 2013-07-24 DIAGNOSIS — Z880 Allergy status to penicillin: Secondary | ICD-10-CM | POA: Insufficient documentation

## 2013-07-24 DIAGNOSIS — I10 Essential (primary) hypertension: Secondary | ICD-10-CM | POA: Insufficient documentation

## 2013-07-24 DIAGNOSIS — Z791 Long term (current) use of non-steroidal anti-inflammatories (NSAID): Secondary | ICD-10-CM | POA: Insufficient documentation

## 2013-07-24 DIAGNOSIS — W1809XA Striking against other object with subsequent fall, initial encounter: Secondary | ICD-10-CM | POA: Insufficient documentation

## 2013-07-24 DIAGNOSIS — Y9389 Activity, other specified: Secondary | ICD-10-CM | POA: Insufficient documentation

## 2013-07-24 HISTORY — DX: Fracture of one rib, unspecified side, initial encounter for closed fracture: S22.39XA

## 2013-07-24 MED ORDER — HYDROCODONE-ACETAMINOPHEN 5-325 MG PO TABS: 1.0000 | ORAL_TABLET | Freq: Four times a day (QID) | ORAL | Status: DC | PRN

## 2013-07-24 NOTE — Discharge Instructions (Signed)
Continue MOBIC for pain. Use her incentive spirometer every several hours, multiple times a day. Take Norco as prescribed as needed for severe pain. Follow with primary care doctor. Return if any shortness of breath.   Rib Fracture A rib fracture is a break or crack in one of the bones of the ribs. The ribs are a group of long, curved bones that wrap around your chest and attach to your spine. They protect your lungs and other organs in the chest cavity. A broken or cracked rib is often painful, but most do not cause other problems. Most rib fractures heal on their own over time. However, rib fractures can be more serious if multiple ribs are broken or if broken ribs move out of place and push against other structures. CAUSES   A direct blow to the chest. For example, this could happen during contact sports, a car accident, or a fall against a hard object.  Repetitive movements with high force, such as pitching a baseball or having severe coughing spells. SYMPTOMS   Pain when you breathe in or cough.  Pain when someone presses on the injured area. DIAGNOSIS  Your caregiver will perform a physical exam. Various imaging tests may be ordered to confirm the diagnosis and to look for related injuries. These tests may include a chest X-ray, computed tomography (CT), magnetic resonance imaging (MRI), or a bone scan. TREATMENT  Rib fractures usually heal on their own in 1 3 months. The longer healing period is often associated with a continued cough or other aggravating activities. During the healing period, pain control is very important. Medication is usually given to control pain. Hospitalization or surgery may be needed for more severe injuries, such as those in which multiple ribs are broken or the ribs have moved out of place.  HOME CARE INSTRUCTIONS   Avoid strenuous activity and any activities or movements that cause pain. Be careful during activities and avoid bumping the injured  rib.  Gradually increase activity as directed by your caregiver.  Only take over-the-counter or prescription medications as directed by your caregiver. Do not take other medications without asking your caregiver first.  Apply ice to the injured area for the first 1 2 days after you have been treated or as directed by your caregiver. Applying ice helps to reduce inflammation and pain.  Put ice in a plastic bag.  Place a towel between your skin and the bag.   Leave the ice on for 15 20 minutes at a time, every 2 hours while you are awake.  Perform deep breathing as directed by your caregiver. This will help prevent pneumonia, which is a common complication of a broken rib. Your caregiver may instruct you to:  Take deep breaths several times a day.  Try to cough several times a day, holding a pillow against the injured area.  Use a device called an incentive spirometer to practice deep breathing several times a day.  Drink enough fluids to keep your urine clear or pale yellow. This will help you avoid constipation.   Do not wear a rib belt or binder. These restrict breathing, which can lead to pneumonia.  SEEK IMMEDIATE MEDICAL CARE IF:   You have a fever.   You have difficulty breathing or shortness of breath.   You develop a continual cough, or you cough up thick or bloody sputum.  You feel sick to your stomach (nausea), throw up (vomit), or have abdominal pain.   You have worsening pain  not controlled with medications.  MAKE SURE YOU:  Understand these instructions.  Will watch your condition.  Will get help right away if you are not doing well or get worse. Document Released: 03/23/2005 Document Revised: 11/23/2012 Document Reviewed: 05/25/2012 [facility] Patient Information 2014 [place], [place].

## 2013-07-24 NOTE — ED Notes (Signed)
C/o fall slipped in shower  Hit rt side on toilet  No loc  Diff to take deep breath

## 2013-07-24 NOTE — ED Provider Notes (Signed)
CSN: [id number]     Arrival date & time 07/24/13  2027 History   First MD Initiated Contact with Patient 07/24/13 2155     Chief Complaint  Patient presents with  . Fall     (Consider location/radiation/quality/duration/timing/severity/associated sxs/prior Treatment) HPI Anthony [name] is a 53 y.o. male who presents to emergency department with complaint of a fall. Patient states he was getting out of the bath tub, and states he slipped on a wet floor and fell hitting right ribs on the toilet. He denies any loss of consciousness or hitting his head. He denies any pain anywhere else other than right ribs. He denies any shortness of breath also states that it is painful for him to take a deep breath. He states pain has been gradually worsening since the fall so he came here. Recent lumbar fusion, states he did not injure his back during a fall but he currently is not taking any medications for it other than [facility]. Patient denies any numbness or weakness in extremities. No abdominal pain. Pain does not radiate. Pain is sharp. Nothing makes it better. Did not take any medications prior to coming in.  Past Medical History  Diagnosis Date  . Hypertension   . GERD (gastroesophageal reflux disease)   . PONV (postoperative nausea and vomiting)     occasional nausea, no vomiting   Past Surgical History  Procedure Laterality Date  . Joint replacement Right jan 2009  . Spinal fusion  May 31, 2013    C 1 to T 1 at [facility]  . Appendectomy  oct 2014   No family history on file. History  Substance Use Topics  . Smoking status: Never Smoker   . Smokeless tobacco: Never Used  . Alcohol Use: No    Review of Systems  Constitutional: Negative for fever and chills.  Respiratory: Negative for cough, chest tightness and shortness of breath.   Cardiovascular: Positive for chest pain. Negative for palpitations and leg swelling.  Gastrointestinal: Negative for nausea, vomiting, abdominal pain, diarrhea  and abdominal distention.  Genitourinary: Negative for dysuria, urgency, frequency and hematuria.  Musculoskeletal: Negative for arthralgias, myalgias, neck pain and neck stiffness.  Skin: Negative for rash.  Allergic/Immunologic: Negative for immunocompromised state.  Neurological: Negative for dizziness, weakness, light-headedness, numbness and headaches.      Allergies  Penicillins and Brassica oleracea italica  Home Medications   Prior to Admission medications   Medication Sig Start Date End Date Taking? Authorizing Provider  B Complex-C (B-COMPLEX WITH VITAMIN C) tablet Take 1 tablet by mouth daily.    Historical Provider, MD  DULoxetine (CYMBALTA) 60 MG capsule Take 60 mg by mouth every evening.    Historical Provider, MD  ezetimibe (ZETIA) 10 MG tablet Take 10 mg by mouth every evening.    Historical Provider, MD  gabapentin (NEURONTIN) 600 MG tablet Take 600 mg by mouth at bedtime.    Historical Provider, MD  HYDROcodone-acetaminophen (NORCO) 5-325 MG per tablet Take 1 tablet by mouth every 6 (six) hours as needed for moderate pain. 07/24/13   Shelton Soler A Nour Scalise, PA-C  lisinopril (PRINIVIL,ZESTRIL) 40 MG tablet Take 40 mg by mouth every morning.    Historical Provider, MD  meloxicam (MOBIC) 15 MG tablet Take 15 mg by mouth daily.    Historical Provider, MD  Multiple Vitamin (MULTIVITAMIN WITH MINERALS) TABS tablet Take 1 tablet by mouth daily.    Historical Provider, MD  omeprazole (PRILOSEC) 40 MG capsule Take 40 mg by mouth daily.  Historical Provider, MD  simvastatin (ZOCOR) 40 MG tablet Take 40 mg by mouth every evening.    Historical Provider, MD   BP 116/84  Pulse 102  Temp(Src) 98.2 F (36.8 C) (Oral)  Resp 20  Ht 5\' 8"  (1.727 m)  Wt 190 lb (86.183 kg)  BMI 28.90 kg/m2  SpO2 98% Physical Exam  Nursing note and vitals reviewed. Constitutional: He is oriented to person, place, and time. He appears well-developed and well-nourished. No distress.  HENT:  Head:  Normocephalic and atraumatic.  Eyes: Conjunctivae are normal.  Neck: Neck supple.  Cardiovascular: Normal rate, regular rhythm and normal heart sounds.   Pulmonary/Chest: Effort normal. No respiratory distress. He has no wheezes. He has no rales. He exhibits tenderness.  No bruising, swelling, deformity noted to the right rib cage. Normal chest movement during respirations. No crepitus palpated. Tenderness along right lower ribs in midaxillary line.  Abdominal: Soft. Bowel sounds are normal. He exhibits no distension. There is no tenderness. There is no rebound.  Musculoskeletal: He exhibits no edema.  Neurological: He is alert and oriented to person, place, and time.  Skin: Skin is warm and dry.    ED Course  Procedures (including critical care time) Labs Review Labs Reviewed - No data to display  Imaging Review Dg Ribs Unilateral W/chest Right  07/24/2013   CLINICAL DATA:  Fall, posterior right rib pain  EXAM: RIGHT RIBS AND CHEST - 3+ VIEW  COMPARISON:  Prior chest x-ray 07/20/2013  FINDINGS: Mildly displaced fracture of the posterolateral aspect of the right ninth rib. No pneumothorax. Low inspiratory volumes with mild bibasilar atelectasis. Stable cardiac and mediastinal contours. Incompletely imaged anterior and posterior cervical spine stabilization hardware.  IMPRESSION: Mildly displaced fracture of the posterolateral aspect of the right ninth rib.   Electronically Signed   By: Malachy MoanHeath  McCullough M.D.   On: 07/24/2013 21:41     EKG Interpretation None      MDM   Final diagnoses:  Right rib fracture    Patient is here after a mechanical fall and hitting right ribs on the toilet. He did not hit his head, no other injuries. Rib x-rays showed mildly displaced fracture of right ninth rib. Patient does not have any problems with bleeding, no abdominal tenderness, no right upper quadrant tenderness. He is ambulatory. He is in no distress. Vital signs are normal, he is nontoxic  appearing. Patient has his own spirometer at home from prior injury. Instructed to use several times a day. We'll prescribe Norco for pain. He is oriented taking MOBIC for his back daily. Instructed to followup with primary care Dr.  Ceasar MonsFiled Vitals:   07/24/13 2031  BP: 116/84  Pulse: 102  Temp: 98.2 F (36.8 C)  TempSrc: Oral  Resp: 20  Height: 5\' 8"  (1.727 m)  Weight: 190 lb (86.183 kg)  SpO2: 98%       Knowledge Escandon A Dsean Vantol, PA-C 07/24/13 2320

## 2013-07-25 NOTE — ED Provider Notes (Signed)
Medical screening examination/treatment/procedure(s) were performed by non-physician practitioner and as supervising physician I was immediately available for consultation/collaboration.   EKG Interpretation None       Marili Vader F Sharita Bienaime, MD 07/25/13 1328 

## 2013-07-27 ENCOUNTER — Encounter (HOSPITAL_COMMUNITY)
Admission: RE | Disposition: A | Discharge status: Home / Self Care | Payer: Self-pay | Source: Ambulatory Visit | Attending: Orthopedic Surgery

## 2013-07-27 ENCOUNTER — Ambulatory Visit (HOSPITAL_COMMUNITY)
Admission: RE | Admit: 2013-07-27 | Discharge: 2013-07-27 | Disposition: A | Discharge status: Home / Self Care | Payer: Federal, State, Local not specified - PPO | Source: Ambulatory Visit | Attending: Orthopedic Surgery | Admitting: Orthopedic Surgery

## 2013-07-27 ENCOUNTER — Encounter (HOSPITAL_COMMUNITY): Payer: Self-pay | Admitting: *Deleted

## 2013-07-27 ENCOUNTER — Encounter (HOSPITAL_COMMUNITY): Payer: Federal, State, Local not specified - PPO | Admitting: *Deleted

## 2013-07-27 ENCOUNTER — Ambulatory Visit (HOSPITAL_COMMUNITY): Payer: Federal, State, Local not specified - PPO | Admitting: *Deleted

## 2013-07-27 DIAGNOSIS — Z79899 Other long term (current) drug therapy: Secondary | ICD-10-CM | POA: Insufficient documentation

## 2013-07-27 DIAGNOSIS — Z9089 Acquired absence of other organs: Secondary | ICD-10-CM | POA: Insufficient documentation

## 2013-07-27 DIAGNOSIS — K219 Gastro-esophageal reflux disease without esophagitis: Secondary | ICD-10-CM | POA: Insufficient documentation

## 2013-07-27 DIAGNOSIS — X58XXXA Exposure to other specified factors, initial encounter: Secondary | ICD-10-CM | POA: Insufficient documentation

## 2013-07-27 DIAGNOSIS — IMO0002 Reserved for concepts with insufficient information to code with codable children: Secondary | ICD-10-CM | POA: Insufficient documentation

## 2013-07-27 DIAGNOSIS — Z981 Arthrodesis status: Secondary | ICD-10-CM | POA: Insufficient documentation

## 2013-07-27 DIAGNOSIS — I10 Essential (primary) hypertension: Secondary | ICD-10-CM | POA: Insufficient documentation

## 2013-07-27 DIAGNOSIS — S83249A Other tear of medial meniscus, current injury, unspecified knee, initial encounter: Secondary | ICD-10-CM | POA: Diagnosis present

## 2013-07-27 HISTORY — PX: KNEE ARTHROSCOPY: SHX127

## 2013-07-27 HISTORY — DX: Fracture of one rib, unspecified side, initial encounter for closed fracture: S22.39XA

## 2013-07-27 SURGERY — ARTHROSCOPY, KNEE
Anesthesia: General | Site: Knee | Laterality: Right

## 2013-07-27 MED ORDER — EPHEDRINE SULFATE 50 MG/ML IJ SOLN
INTRAMUSCULAR | Status: DC | PRN
  Administered 2013-07-27: 10 mg via INTRAVENOUS

## 2013-07-27 MED ORDER — MEPERIDINE HCL 50 MG/ML IJ SOLN
6.2500 mg | INTRAMUSCULAR | Status: DC | PRN
  Administered 2013-07-27: 6.25 mg via INTRAVENOUS

## 2013-07-27 MED ORDER — METHOCARBAMOL 500 MG PO TABS: 500.0000 mg | ORAL_TABLET | Freq: Four times a day (QID) | ORAL | Status: DC

## 2013-07-27 MED ORDER — BUPIVACAINE-EPINEPHRINE (PF) 0.25% -1:200000 IJ SOLN
INTRAMUSCULAR | Status: AC
  Filled 2013-07-27: qty 30

## 2013-07-27 MED ORDER — SODIUM CHLORIDE 0.9 % IJ SOLN
INTRAMUSCULAR | Status: AC
  Filled 2013-07-27: qty 10

## 2013-07-27 MED ORDER — ACETAMINOPHEN 10 MG/ML IV SOLN
1000.0000 mg | Freq: Once | INTRAVENOUS | Status: AC
  Administered 2013-07-27: 1000 mg via INTRAVENOUS
  Filled 2013-07-27: qty 100

## 2013-07-27 MED ORDER — FENTANYL CITRATE 0.05 MG/ML IJ SOLN
25.0000 ug | INTRAMUSCULAR | Status: DC | PRN
Start: 2013-07-27 — End: 2013-07-27

## 2013-07-27 MED ORDER — KETOROLAC TROMETHAMINE 30 MG/ML IJ SOLN: 15.0000 mg | Freq: Once | INTRAMUSCULAR | Status: DC | PRN

## 2013-07-27 MED ORDER — DEXAMETHASONE SODIUM PHOSPHATE 10 MG/ML IJ SOLN
INTRAMUSCULAR | Status: DC | PRN
  Administered 2013-07-27: 10 mg via INTRAVENOUS

## 2013-07-27 MED ORDER — KETOROLAC TROMETHAMINE 30 MG/ML IJ SOLN
INTRAMUSCULAR | Status: DC | PRN
  Administered 2013-07-27: 30 mg via INTRAVENOUS

## 2013-07-27 MED ORDER — BUPIVACAINE-EPINEPHRINE 0.25% -1:200000 IJ SOLN
INTRAMUSCULAR | Status: DC | PRN
  Administered 2013-07-27: 20 mL

## 2013-07-27 MED ORDER — KETOROLAC TROMETHAMINE 30 MG/ML IJ SOLN
INTRAMUSCULAR | Status: AC
  Filled 2013-07-27: qty 1

## 2013-07-27 MED ORDER — MEPERIDINE HCL 50 MG/ML IJ SOLN
INTRAMUSCULAR | Status: AC
  Filled 2013-07-27: qty 1

## 2013-07-27 MED ORDER — EPHEDRINE SULFATE 50 MG/ML IJ SOLN
INTRAMUSCULAR | Status: AC
  Filled 2013-07-27: qty 1

## 2013-07-27 MED ORDER — PROMETHAZINE HCL 25 MG/ML IJ SOLN: 6.2500 mg | INTRAMUSCULAR | Status: DC | PRN

## 2013-07-27 MED ORDER — VANCOMYCIN HCL IN DEXTROSE 1-5 GM/200ML-% IV SOLN
1000.0000 mg | Freq: Once | INTRAVENOUS | Status: AC
  Administered 2013-07-27: 1000 mg via INTRAVENOUS

## 2013-07-27 MED ORDER — LACTATED RINGERS IR SOLN
Status: DC | PRN
  Administered 2013-07-27: 6000 mL

## 2013-07-27 MED ORDER — VANCOMYCIN HCL IN DEXTROSE 1-5 GM/200ML-% IV SOLN
INTRAVENOUS | Status: AC
  Filled 2013-07-27: qty 200

## 2013-07-27 MED ORDER — HYDROCODONE-ACETAMINOPHEN 5-325 MG PO TABS: 1.0000 | ORAL_TABLET | Freq: Four times a day (QID) | ORAL | Status: DC | PRN

## 2013-07-27 MED ORDER — PROPOFOL 10 MG/ML IV BOLUS
INTRAVENOUS | Status: DC | PRN
  Administered 2013-07-27: 200 mg via INTRAVENOUS
  Administered 2013-07-27: 50 mg via INTRAVENOUS

## 2013-07-27 MED ORDER — MIDAZOLAM HCL 5 MG/5ML IJ SOLN
INTRAMUSCULAR | Status: DC | PRN
Start: 2013-07-27 — End: 2013-07-27
  Administered 2013-07-27: 2 mg via INTRAVENOUS

## 2013-07-27 MED ORDER — LACTATED RINGERS IV SOLN
INTRAVENOUS | Status: DC
  Administered 2013-07-27: 14:00:00 via INTRAVENOUS

## 2013-07-27 MED ORDER — PROPOFOL 10 MG/ML IV BOLUS
INTRAVENOUS | Status: AC
  Filled 2013-07-27: qty 20

## 2013-07-27 MED ORDER — FENTANYL CITRATE 0.05 MG/ML IJ SOLN
INTRAMUSCULAR | Status: DC | PRN
  Administered 2013-07-27 (×2): 50 ug via INTRAVENOUS

## 2013-07-27 MED ORDER — LIDOCAINE HCL (CARDIAC) 20 MG/ML IV SOLN
INTRAVENOUS | Status: DC | PRN
  Administered 2013-07-27: 100 mg via INTRAVENOUS

## 2013-07-27 SURGICAL SUPPLY — 26 items
BANDAGE ELASTIC 6 VELCRO ST LF (GAUZE/BANDAGES/DRESSINGS) ×1 IMPLANT
BLADE 4.2CUDA (BLADE) ×2 IMPLANT
CLOTH BEACON ORANGE TIMEOUT ST (SAFETY) ×2 IMPLANT
COUNTER NEEDLE 20 DBL MAG RED (NEEDLE) ×2 IMPLANT
CUFF TOURN SGL QUICK 34 (TOURNIQUET CUFF) ×2
CUFF TRNQT CYL 34X4X40X1 (TOURNIQUET CUFF) ×1 IMPLANT
DRAPE U-SHAPE 47X51 STRL (DRAPES) ×2 IMPLANT
DRSG EMULSION OIL 3X3 NADH (GAUZE/BANDAGES/DRESSINGS) ×2 IMPLANT
DURAPREP 26ML APPLICATOR (WOUND CARE) ×2 IMPLANT
GLOVE BIO SURGEON STRL SZ8 (GLOVE) ×2 IMPLANT
GLOVE BIOGEL PI IND STRL 8 (GLOVE) ×1 IMPLANT
GLOVE BIOGEL PI INDICATOR 8 (GLOVE) ×1
GOWN STRL REUS W/TWL LRG LVL3 (GOWN DISPOSABLE) ×2 IMPLANT
MANIFOLD NEPTUNE II (INSTRUMENTS) ×2 IMPLANT
PACK ARTHROSCOPY WL (CUSTOM PROCEDURE TRAY) ×2 IMPLANT
PACK ICE MAXI GEL EZY WRAP (MISCELLANEOUS) ×6 IMPLANT
PAD ABD 8X10 STRL (GAUZE/BANDAGES/DRESSINGS) ×1 IMPLANT
PADDING CAST COTTON 6X4 STRL (CAST SUPPLIES) ×3 IMPLANT
POSITIONER SURGICAL ARM (MISCELLANEOUS) ×2 IMPLANT
SET ARTHROSCOPY TUBING (MISCELLANEOUS) ×2
SET ARTHROSCOPY TUBING LN (MISCELLANEOUS) ×1 IMPLANT
SPONGE GAUZE 4X4 12PLY (GAUZE/BANDAGES/DRESSINGS) ×1 IMPLANT
SUT ETHILON 4 0 PS 2 18 (SUTURE) ×2 IMPLANT
TOWEL OR 17X26 10 PK STRL BLUE (TOWEL DISPOSABLE) ×2 IMPLANT
WAND 90 DEG TURBOVAC W/CORD (SURGICAL WAND) ×1 IMPLANT
WRAP KNEE MAXI GEL POST OP (GAUZE/BANDAGES/DRESSINGS) ×2 IMPLANT

## 2013-07-27 NOTE — Anesthesia Preprocedure Evaluation (Addendum)
Anesthesia Evaluation  Patient identified by MRN, date of birth, ID band Patient awake    Reviewed: Allergy & Precautions, H&P , NPO status , Patient's Chart, lab work & pertinent test results  Airway Mallampati: II TM Distance: >3 FB Neck ROM: Full    Dental no notable dental hx.    Pulmonary neg pulmonary ROS,  breath sounds clear to auscultation  Pulmonary exam normal       Cardiovascular hypertension, Pt. on medications Rhythm:Regular Rate:Normal     Neuro/Psych negative neurological ROS  negative psych ROS   GI/Hepatic Neg liver ROS, GERD-  Medicated,  Endo/Other  negative endocrine ROS  Renal/GU negative Renal ROS  negative genitourinary   Musculoskeletal negative musculoskeletal ROS (+)   Abdominal   Peds negative pediatric ROS (+)  Hematology negative hematology ROS (+)   Anesthesia Other Findings   Reproductive/Obstetrics negative OB ROS                          Anesthesia Physical Anesthesia Plan  ASA: II  Anesthesia Plan: General   Post-op Pain Management:    Induction: Intravenous  Airway Management Planned: LMA  Additional Equipment:   Intra-op Plan:   Post-operative Plan: Extubation in OR  Informed Consent: I have reviewed the patients History and Physical, chart, labs and discussed the procedure including the risks, benefits and alternatives for the proposed anesthesia with the patient or authorized representative who has indicated his/her understanding and acceptance.   Dental advisory given  Plan Discussed with: CRNA and Surgeon  Anesthesia Plan Comments:        Anesthesia Quick Evaluation

## 2013-07-27 NOTE — H&P (Signed)
  CC- Anthony [name] is a 53 y.o. male who presents with right knee pain.  HPI- . Knee Pain: Patient presents with knee pain involving the  right knee. Onset of the symptoms was several months ago. Inciting event: none known. Current symptoms include giving out, pain located medially and stiffness. Pain is aggravated by going up and down stairs, pivoting, rising after sitting and squatting.  Patient has had no prior knee problems. Evaluation to date: MRI: abnormal medial meniscal tear. Treatment to date: rest.  Past Medical History  Diagnosis Date  . Hypertension   . GERD (gastroesophageal reflux disease)   . PONV (postoperative nausea and vomiting)     occasional nausea, no vomiting  . Broken rib 07/24/2013    s/p fall    Past Surgical History  Procedure Laterality Date  . Joint replacement Right jan 2009  . Spinal fusion  May 31, 2013    C 1 to T 1 at [facility]  . Appendectomy  oct 2014    Prior to Admission medications   Medication Sig Start Date End Date Taking? Authorizing Provider  B Complex-C (B-COMPLEX WITH VITAMIN C) tablet Take 1 tablet by mouth daily.   Yes Historical Provider, MD  DULoxetine (CYMBALTA) 60 MG capsule Take 60 mg by mouth every evening.   Yes Historical Provider, MD  ezetimibe (ZETIA) 10 MG tablet Take 10 mg by mouth every evening.   Yes Historical Provider, MD  gabapentin (NEURONTIN) 600 MG tablet Take 600 mg by mouth at bedtime.   Yes Historical Provider, MD  lisinopril (PRINIVIL,ZESTRIL) 40 MG tablet Take 40 mg by mouth every morning.   Yes Historical Provider, MD  meloxicam (MOBIC) 15 MG tablet Take 15 mg by mouth daily.   Yes Historical Provider, MD  Multiple Vitamin (MULTIVITAMIN WITH MINERALS) TABS tablet Take 1 tablet by mouth daily.   Yes Historical Provider, MD  omeprazole (PRILOSEC) 40 MG capsule Take 40 mg by mouth daily.   Yes Historical Provider, MD  simvastatin (ZOCOR) 40 MG tablet Take 40 mg by mouth every evening.   Yes Historical Provider, MD   HYDROcodone-acetaminophen (NORCO) 5-325 MG per tablet Take 1 tablet by mouth every 6 (six) hours as needed for moderate pain. 07/24/13   Tatyana A Kirichenko, PA-C   KNEE EXAM antalgic gait, soft tissue tenderness over medial joint line,no effusion, negative drawer sign, collateral ligaments intact  Physical Examination: General appearance - alert, well appearing, and in no distress Mental status - alert, oriented to person, place, and time Chest - clear to auscultation, no wheezes, rales or rhonchi, symmetric air entry Heart - normal rate, regular rhythm, normal S1, S2, no murmurs, rubs, clicks or gallops Abdomen - soft, nontender, nondistended, no masses or organomegaly Neurological - alert, oriented, normal speech, no focal findings or movement disorder noted   Asessment/Plan--- Right knee medial meniscal tear- - Plan left knee arthroscopy with meniscal debridement. Procedure risks and potential comps discussed with patient who elects to proceed. Goals are decreased pain and increased function with a high likelihood of achieving both

## 2013-07-27 NOTE — Interval H&P Note (Signed)
History and Physical Interval Note:  07/27/2013 2:20 PM  Rhunette CroftDon Piccini  has presented today for surgery, with the diagnosis of RIGHT KNEE MEDIAL MENISCUS TEAR  The various methods of treatment have been discussed with the patient and family. After consideration of risks, benefits and other options for treatment, the patient has consented to  Procedure(s): RIGHT ARTHROSCOPY KNEE WITH DEBRIDEMENT (Right) as a surgical intervention .  The patient's history has been reviewed, patient examined, no change in status, stable for surgery.  I have reviewed the patient's chart and labs.  Questions were answered to the patient's satisfaction.     Gus RankinFrank V Mikhaila Roh

## 2013-07-27 NOTE — Op Note (Signed)
Preoperative diagnosis-  Right knee medial meniscal tear  Postoperative diagnosis Right- knee medial meniscal tear   Procedure- Right knee arthroscopy with medial meniscal debridement     Surgeon- Gus RankinFrank V. Avaleigh Decuir, MD  Anesthesia-General  EBL-  minimal Complications- None  Condition- PACU - hemodynamically stable.  Brief clinical note- -Anthony CroftDon [name] is a 53 y.o.  male with a several month history of right knee pain and mechanical symptoms. Exam and history suggested medial meniscal tear confirmed by MRI. The patient presents now for arthroscopy and debridement   Procedure in detail -       After successful administration of General anesthetic, a tourmiquet is placed high on the Right  thigh and the Right lower extremity is prepped and draped in the usual sterile fashion. Time out is performed by the surgical team. Standard superomedial and inferolateral portal sites are marked and incisions made with an 11 blade. The inflow cannula is passed through the superomedial portal and camera through the inferolateral portal and inflow is initiated. Arthroscopic visualization proceeds.      The undersurface of the patella and trochlea are visualized and there are grade II changes of both surfaces without any unstable cartilage defects.. The medial and lateral gutters are visualized and there are  no loose bodies. Flexion and valgus force is applied to the knee and the medial compartment is entered. A spinal needle is passed into the joint through the site marked for the inferomedial portal. A small incision is made and the dilator passed into the joint. The findings for the medial compartment are bucket handle tear of the medial meniscus with displaced fragments . The fragments are removed by detaching them with the arthroscopic scissors and removing them from the joint. The tear is debrided to a stable base with baskets and a shaver and sealed off with the Arthrocare . It is probed and found to be  stable.    The intercondylar notch is visualized and the ACL appears normal,. The lateral compartment is entered and the findings are normal .       The joint is again inspected and there are no other tears, defects or loose bodies identified. The arthroscopic equipment is then removed from the inferior portals which are closed with interrupted 4-0 nylon. 20 ml of .25% Marcaine with epinephrine are injected through the inflow cannula and the cannula is then removed and the portal closed with nylon. The incisions are cleaned and dried and a bulky sterile dressing is applied. The patient is then awakened and transported to recovery in stable condition.   07/27/2013, 3:10 PM

## 2013-07-27 NOTE — Anesthesia Postprocedure Evaluation (Signed)
  Anesthesia Post-op Note  Patient: Anthony [name]  Procedure(s) Performed: Procedure(s) (LRB): RIGHT ARTHROSCOPY KNEE WITH MEDIAL MENSICUS DEBRIDEMENT (Right)  Patient Location: PACU  Anesthesia Type: General  Level of Consciousness: awake and alert   Airway and Oxygen Therapy: Patient Spontanous Breathing  Post-op Pain: mild  Post-op Assessment: Post-op Vital signs reviewed, Patient's Cardiovascular Status Stable, Respiratory Function Stable, Patent Airway and No signs of Nausea or vomiting  Last Vitals:  Filed Vitals:   07/27/13 1530  BP: 104/77  Pulse: 90  Temp:   Resp: 12    Post-op Vital Signs: stable   Complications: No apparent anesthesia complications

## 2013-07-27 NOTE — Transfer of Care (Signed)
Immediate Anesthesia Transfer of Care Note  Patient: Anthony CroftDon [name]  Procedure(s) Performed: Procedure(s): RIGHT ARTHROSCOPY KNEE WITH MEDIAL MENSICUS DEBRIDEMENT (Right)  Patient Location: PACU  Anesthesia Type:General  Level of Consciousness: awake and oriented  Airway & Oxygen Therapy: Patient Spontanous Breathing and Patient connected to face mask oxygen  Post-op Assessment: Report given to PACU RN and Post -op Vital signs reviewed and stable  Post vital signs: Reviewed and stable  Complications: No apparent anesthesia complications

## 2013-07-27 NOTE — Discharge Instructions (Signed)
Arthroscopic Procedure, Knee °An arthroscopic procedure can find what is wrong with your knee. °PROCEDURE °Arthroscopy is a surgical technique that allows your orthopedic surgeon to diagnose and treat your knee injury with accuracy. They will look into your knee through a small instrument. This is almost like a small (pencil sized) telescope. Because arthroscopy affects your knee less than open knee surgery, you can anticipate a more rapid recovery. Taking an active role by following your caregiver's instructions will help with rapid and complete recovery. Use crutches, rest, elevation, ice, and knee exercises as instructed. The length of recovery depends on various factors including type of injury, age, physical condition, medical conditions, and your rehabilitation. °Your knee is the joint between the large bones (femur and tibia) in your leg. Cartilage covers these bone ends which are smooth and slippery and allow your knee to bend and move smoothly. Two menisci, thick, semi-lunar shaped pads of cartilage which form a rim inside the joint, help absorb shock and stabilize your knee. Ligaments bind the bones together and support your knee joint. Muscles move the joint, help support your knee, and take stress off the joint itself. Because of this all programs and physical therapy to rehabilitate an injured or repaired knee require rebuilding and strengthening your muscles. °AFTER THE PROCEDURE °· After the procedure, you will be moved to a recovery area until most of the effects of the medication have worn off. Your caregiver will discuss the test results with you.  °· Only take over-the-counter or prescription medicines for pain, discomfort, or fever as directed by your caregiver.  °SEEK MEDICAL CARE IF:  °· You have increased bleeding from your wounds.  °· You see redness, swelling, or have increasing pain in your wounds.  °· You have pus coming from your wound.  °· You have an oral temperature above 102° F (38.9°  C).  °· You notice a bad smell coming from the wound or dressing.  °· You have severe pain with any motion of your knee.  °SEEK IMMEDIATE MEDICAL CARE IF:  °· You develop a rash.  °· You have difficulty breathing.  °· You have any allergic problems.  °FURTHER INSTRUCTIONS: °· You may start showering two days after being discharged home but do not submerge the incisions under water.  °· Change dressing 48 hours after the procedure and then cover the small incisions with band aids until your follow up visit. °· Avoid periods of inactivity such as sitting longer than an hour when not asleep. This helps prevent blood clots.  °· You may put full weight on your legs and walk as much as is comfortable.  °· Do not drive while taking narcotics.  °Wear the elastic stockings for three weeks following surgery during the day but you may remove then at night. °· Make sure you keep all of your appointments after your operation with all of your doctors and caregivers. You should call the office at [phone] and make an appointment for approximately one week after the date of your surgery. °· Please pick up a stool softener and laxative for home use as long as you are requiring pain medications. °· Continue to use ice on the knee for pain and swelling from surgery. You may notice swelling that will progress down to the foot and ankle.  This is normal after surgery.  Elevate the leg when you are not up walking on it.   °RANGE OF MOTION AND STRENGTHENING EXERCISES  °Rehabilitation of the knee is   important following a knee injury or an operation. After just a few days of immobilization, the muscles of the thigh which control the knee become weakened and shrink (atrophy). Knee exercises are designed to build up the tone and strength of the thigh muscles and to improve knee motion. Often times heat used for twenty to thirty minutes before working out will loosen up your tissues and help with improving the range of motion but do not  use heat for the first two weeks following surgery. These exercises can be done on a training (exercise) mat, on the floor, on a table or on a bed. Use what ever works the best and is most comfortable for you Knee exercises include: ° ° ° ° ° ° °QUAD STRENGTHENING EXERCISES °Strengthening Quadriceps Sets ° °Tighten muscles on top of thigh by pushing knees down into floor or table. °Hold for 20 seconds. Repeat 10 times. °Do 2 sessions per day. ° ° ° °Strengthening Terminal Knee Extension ° °With knee bent over bolster, straighten knee by tightening muscle on top of thigh. Be sure to keep bottom of knee on bolster. °Hold for 20 seconds. Repeat 10 times. °Do 2 sessions per day. ° ° °Straight Leg with Bent Knee ° °Lie on back with opposite leg bent. Keep involved knee slightly bent at knee and raise leg 4-6". Hold for 10 seconds. °Repeat 20 times per set. °Do 2 sets per session. °Do 2 sessions per day. ° °

## 2013-07-28 ENCOUNTER — Encounter (HOSPITAL_COMMUNITY): Payer: Self-pay | Admitting: Orthopedic Surgery

## 2014-01-19 ENCOUNTER — Other Ambulatory Visit: Payer: Self-pay

## 2014-10-02 ENCOUNTER — Telehealth: Payer: Self-pay | Admitting: Physical Medicine & Rehabilitation

## 2014-10-02 NOTE — Telephone Encounter (Signed)
Anthony [name] called- "states it has been a year since he was seen but its documented in his chart that he [name] call and schedule nerve ablations.- Made an appt for 8/08 but does he need to be seen first?  He has not been seen since 2014."  This patient has actually not been seen in the office since 2012.  He will have to be scheduled as a new patient for evaluation.

## 2014-10-23 ENCOUNTER — Ambulatory Visit: Payer: Federal, State, Local not specified - PPO | Admitting: Physical Medicine & Rehabilitation

## 2014-11-02 ENCOUNTER — Ambulatory Visit: Payer: Federal, State, Local not specified - PPO | Admitting: Physical Medicine & Rehabilitation

## 2014-11-02 ENCOUNTER — Ambulatory Visit: Payer: Federal, State, Local not specified - PPO

## 2014-11-15 ENCOUNTER — Ambulatory Visit: Payer: Federal, State, Local not specified - PPO | Admitting: Physical Medicine & Rehabilitation

## 2015-04-15 ENCOUNTER — Encounter: Payer: Federal, State, Local not specified - PPO | Attending: Physical Medicine & Rehabilitation

## 2015-04-15 ENCOUNTER — Ambulatory Visit: Payer: Federal, State, Local not specified - PPO | Admitting: Physical Medicine & Rehabilitation

## 2015-04-15 DIAGNOSIS — Z981 Arthrodesis status: Secondary | ICD-10-CM | POA: Insufficient documentation

## 2015-04-15 DIAGNOSIS — I10 Essential (primary) hypertension: Secondary | ICD-10-CM | POA: Insufficient documentation

## 2015-04-15 DIAGNOSIS — M79672 Pain in left foot: Secondary | ICD-10-CM | POA: Insufficient documentation

## 2015-04-15 DIAGNOSIS — M2580 Other specified joint disorders, unspecified joint: Secondary | ICD-10-CM | POA: Insufficient documentation

## 2015-04-15 DIAGNOSIS — Z9889 Other specified postprocedural states: Secondary | ICD-10-CM | POA: Insufficient documentation

## 2015-04-15 DIAGNOSIS — M545 Low back pain: Secondary | ICD-10-CM | POA: Insufficient documentation

## 2015-04-15 DIAGNOSIS — K219 Gastro-esophageal reflux disease without esophagitis: Secondary | ICD-10-CM | POA: Insufficient documentation

## 2015-04-22 ENCOUNTER — Ambulatory Visit (HOSPITAL_BASED_OUTPATIENT_CLINIC_OR_DEPARTMENT_OTHER): Payer: Federal, State, Local not specified - PPO | Admitting: Physical Medicine & Rehabilitation

## 2015-04-22 ENCOUNTER — Encounter: Payer: Self-pay | Admitting: Physical Medicine & Rehabilitation

## 2015-04-22 VITALS — BP 110/73 | HR 72 | Resp 16

## 2015-04-22 DIAGNOSIS — M79672 Pain in left foot: Secondary | ICD-10-CM | POA: Diagnosis present

## 2015-04-22 DIAGNOSIS — I10 Essential (primary) hypertension: Secondary | ICD-10-CM | POA: Diagnosis not present

## 2015-04-22 DIAGNOSIS — M2580 Other specified joint disorders, unspecified joint: Secondary | ICD-10-CM | POA: Diagnosis not present

## 2015-04-22 DIAGNOSIS — M533 Sacrococcygeal disorders, not elsewhere classified: Secondary | ICD-10-CM

## 2015-04-22 DIAGNOSIS — K219 Gastro-esophageal reflux disease without esophagitis: Secondary | ICD-10-CM | POA: Diagnosis not present

## 2015-04-22 DIAGNOSIS — M25552 Pain in left hip: Secondary | ICD-10-CM | POA: Diagnosis present

## 2015-04-22 DIAGNOSIS — G8929 Other chronic pain: Secondary | ICD-10-CM

## 2015-04-22 DIAGNOSIS — M545 Low back pain: Secondary | ICD-10-CM | POA: Diagnosis present

## 2015-04-22 DIAGNOSIS — Z9889 Other specified postprocedural states: Secondary | ICD-10-CM | POA: Diagnosis not present

## 2015-04-22 DIAGNOSIS — Z981 Arthrodesis status: Secondary | ICD-10-CM | POA: Diagnosis not present

## 2015-04-22 NOTE — Progress Notes (Signed)
Subjective:    Patient ID: Anthony [name], male    DOB: 05-09-1960, 55 y.o.   MRN: [id number]  HPI CCLeft sided pain back into groin and into foot  Has had neck surgery x 2 since last visit.  Dr Kathi Ludwig  Left Sacroiliac injection gave short term relief  Has had prior radiofrequency ablations  which gave him one year plus relief. Last one was performed around 5 years ago  Patient has had some gait disturbance prior to his next surgery but this has improved. No significant right-sided low back or hip pain. Pain Inventory Average Pain 3 Pain Right Now 5 My pain is intermittent, burning, tingling and aching  In the last 24 hours, has pain interfered with the following? General activity 3 Relation with others 0 Enjoyment of life 1 What TIME of day is your pain at its worst? morning Sleep (in general) Poor  Pain is worse with: inactivity and standing Pain improves with: therapy/exercise and TENS Relief from Meds: 4  Mobility walk without assistance how many minutes can you walk? 30 ability to climb steps?  yes do you drive?  yes transfers alone Do you have any goals in this area?  no  Function employed # of hrs/week 48 Do you have any goals in this area?  no  Neuro/Psych weakness numbness  Prior Studies Any changes since last visit?  no  Physicians involved in your care Any changes since last visit?  no   History reviewed. No pertinent family history. Social History   Social History  . Marital Status: Single    Spouse Name: N/A  . Number of Children: N/A  . Years of Education: N/A   Social History Main Topics  . Smoking status: Never Smoker   . Smokeless tobacco: Never Used  . Alcohol Use: No  . Drug Use: No  . Sexual Activity: Not Asked   Other Topics Concern  . None   Social History Narrative   Past Surgical History  Procedure Laterality Date  . Joint replacement Right jan 2009  . Spinal fusion  May 31, 2013    C 1 to T 1 at [facility]  .  Appendectomy  oct 2014  . Knee arthroscopy Right 07/27/2013    Procedure: RIGHT ARTHROSCOPY KNEE WITH MEDIAL MENSICUS DEBRIDEMENT;  Surgeon: Loanne Drilling, MD;  Location: [facility];  Service: Orthopedics;  Laterality: Right;   Past Medical History  Diagnosis Date  . Hypertension   . GERD (gastroesophageal reflux disease)   . PONV (postoperative nausea and vomiting)     occasional nausea, no vomiting  . Broken rib 07/24/2013    s/p fall   BP 110/73 mmHg  Pulse 72  Resp 16  SpO2 94%  Opioid Risk Score:   Fall Risk Score:  `1  Depression screen PHQ 2/9  Depression screen PHQ 2/9 04/22/2015  Decreased Interest 0  Down, Depressed, Hopeless 0  PHQ - 2 Score 0  Altered sleeping 3  Tired, decreased energy 1  Change in appetite 1  Feeling bad or failure about yourself  0  Trouble concentrating 0  Moving slowly or fidgety/restless 0  Suicidal thoughts 0  PHQ-9 Score 5  Difficult doing work/chores Not difficult at all     Review of Systems  All other systems reviewed and are negative.      Objective:   Physical Exam  Constitutional: He is oriented to person, place, and time. He appears well-developed.  HENT:  Head: Normocephalic and atraumatic.  Eyes: Conjunctivae are normal. Pupils are equal, round, and reactive to light.  Neck: Normal range of motion.  Neurological: He is alert and oriented to person, place, and time. He has normal strength.  Reflex Scores:      Patellar reflexes are 3+ on the right side and 3+ on the left side.      Achilles reflexes are 3+ on the right side and 3+ on the left side. Motor strength is 5/5 bilateral hip flexor and extensor ankle dorsiflexor Sensation reduced left S1 dermatomal distribution   Psychiatric: He has a normal mood and affect.  Nursing note and vitals reviewed.         Assessment & Plan:  1. Left sacroiliac disorder he's had short-term relief with sacroiliac injections under fluoroscopic guidance. He's had prior similar  issues on the right side which have responded very nicely to right sacroiliac radiofrequency ablation. The Will schedule for left sacroiliac radiofrequency ablation under fluoroscopic guidance.  Discussed with patient and his wife. Agree with plan

## 2015-04-22 NOTE — Patient Instructions (Signed)
Would recommend radiofrequency ablation left sacroiliac  If you have residual pain after this procedure it may be related to a lumbar disc and an MRI may be needed.

## 2015-05-20 ENCOUNTER — Encounter: Payer: Self-pay | Admitting: Physical Medicine & Rehabilitation

## 2015-05-20 ENCOUNTER — Encounter: Payer: Federal, State, Local not specified - PPO | Attending: Physical Medicine & Rehabilitation

## 2015-05-20 ENCOUNTER — Ambulatory Visit (HOSPITAL_BASED_OUTPATIENT_CLINIC_OR_DEPARTMENT_OTHER): Payer: Federal, State, Local not specified - PPO | Admitting: Physical Medicine & Rehabilitation

## 2015-05-20 VITALS — BP 126/65 | HR 79 | Resp 14

## 2015-05-20 DIAGNOSIS — M545 Low back pain: Secondary | ICD-10-CM | POA: Diagnosis present

## 2015-05-20 DIAGNOSIS — M79672 Pain in left foot: Secondary | ICD-10-CM | POA: Insufficient documentation

## 2015-05-20 DIAGNOSIS — Z981 Arthrodesis status: Secondary | ICD-10-CM | POA: Diagnosis not present

## 2015-05-20 DIAGNOSIS — K219 Gastro-esophageal reflux disease without esophagitis: Secondary | ICD-10-CM | POA: Diagnosis not present

## 2015-05-20 DIAGNOSIS — G8929 Other chronic pain: Secondary | ICD-10-CM | POA: Diagnosis not present

## 2015-05-20 DIAGNOSIS — Z9889 Other specified postprocedural states: Secondary | ICD-10-CM | POA: Diagnosis not present

## 2015-05-20 DIAGNOSIS — M533 Sacrococcygeal disorders, not elsewhere classified: Secondary | ICD-10-CM | POA: Diagnosis not present

## 2015-05-20 DIAGNOSIS — I10 Essential (primary) hypertension: Secondary | ICD-10-CM | POA: Insufficient documentation

## 2015-05-20 DIAGNOSIS — M2580 Other specified joint disorders, unspecified joint: Secondary | ICD-10-CM | POA: Insufficient documentation

## 2015-05-20 DIAGNOSIS — M25552 Pain in left hip: Secondary | ICD-10-CM | POA: Diagnosis present

## 2015-05-20 MED ORDER — GABAPENTIN 600 MG PO TABS: 600.0000 mg | ORAL_TABLET | Freq: Three times a day (TID) | ORAL | Status: AC

## 2015-05-20 NOTE — Patient Instructions (Signed)

## 2015-05-20 NOTE — Progress Notes (Signed)
Sacroiliac radio frequency ablation under fluoroscopic guidance This consists of L4 medial branch, L5 dorsal ramus radio frequency ablation plus neuro lysis of S1-S2 -S3 dorsal rami  Indication is sacroiliac pain which has improved temporarily on 2 or more occasions by at least 50% following sacroiliac intra-articular injection under fluoroscopic guidance. Pain interferes with self-care and mobility and has failed to respond to conservative measures.  Informed consent was obtained after discussing risks and benefits of the procedure with the patient these include bleeding bruising and infection temporary or permanent paralysis. The patient elects to proceed and has given written consent.  Patient placed prone on fluoroscopy table. Area marked and prepped with Betadine. Fluoroscopic images utilized to guide needle. 25-gauge 1.5 inch needle was used to anesthetize 5 injection points with 2 cc of 1% lidocaine each. Then a 20-gauge 10 cm RF needle with a 10 mm curved active tip was inserted under fluoroscopic guidance first Harding the S1 SA P./sacral ala junction, bone contact made and confirmed with lateral imaging. Sensory stim at 50 Hz followed by motor stimulation at 2 Hz confirm proper needle location followed by injection of one cc of a solution containing 4 mg dexamethasone and 4 ML of 1% lidocaine MPF. Then the L5 SEP transverse process junction was targeted. Bone contact was made. Needle tip confirmed on lateral imaging. Sensory stimulation at 50 Hz followed by motor stimulation at 2 Hz confirm upper needle location followed by injection of one ML of the dexamethasone/lidocaine solution. Radio frequency 80C for 80 seconds was performed. Then the lateral aspect of the S1, S2 and S3 sacral foramina were targeted. Bone contact made. Sensory stim at 50 Hz followed by motor stim at 2 Hz confirm proper needle location. One ML of the dexamethasone/lidocaine solution was injected into each of 3 sites and  radio frequency ablation 80C for 90 seconds was performed. Patient tolerated procedure well. Post procedure instructions given

## 2015-05-20 NOTE — Progress Notes (Signed)
  PROCEDURE RECORD [facility]   Name: Anthony [name] DOB:1961/02/18 MRN: [id number]  Date:05/20/2015  Physician: Claudette Laws, MD    Nurse/CMA: Purvis Sheffield  Allergies:  Allergies  Allergen Reactions  . Penicillins Anaphylaxis  . Brassica Oleracea Italica Nausea And Vomiting    broccoli    Consent Signed: Yes.    Is patient diabetic? No.  CBG today? n/a  Pregnant: No. LMP: No LMP for male patient. (age 57-55)  Anticoagulants: no Anti-inflammatory: no Antibiotics: no  Procedure: left sacroiliac radiofrequency neurotomy  Position: Prone Start Time: 1:04pm  End Time: 1:31pm  Fluoro Time: 1:02  RN/CMA Teresa Coombs Lajean Boese    Time 12:46pm 1:37pm    BP 126/65 127/69    Pulse 79 80    Respirations 14 14    O2 Sat 95 96    S/S 6 6    Pain Level 6/10 2/10     D/C home with wife, patient A & O X 3, D/C instructions reviewed, and sits independently.

## 2015-06-17 ENCOUNTER — Encounter: Payer: Federal, State, Local not specified - PPO | Attending: Physical Medicine & Rehabilitation

## 2015-06-17 ENCOUNTER — Ambulatory Visit (HOSPITAL_BASED_OUTPATIENT_CLINIC_OR_DEPARTMENT_OTHER): Payer: Federal, State, Local not specified - PPO | Admitting: Physical Medicine & Rehabilitation

## 2015-06-17 ENCOUNTER — Encounter: Payer: Self-pay | Admitting: Physical Medicine & Rehabilitation

## 2015-06-17 VITALS — BP 117/76 | HR 79 | Resp 14

## 2015-06-17 DIAGNOSIS — M545 Low back pain: Secondary | ICD-10-CM | POA: Diagnosis present

## 2015-06-17 DIAGNOSIS — I10 Essential (primary) hypertension: Secondary | ICD-10-CM | POA: Diagnosis not present

## 2015-06-17 DIAGNOSIS — M79672 Pain in left foot: Secondary | ICD-10-CM | POA: Insufficient documentation

## 2015-06-17 DIAGNOSIS — K219 Gastro-esophageal reflux disease without esophagitis: Secondary | ICD-10-CM | POA: Diagnosis not present

## 2015-06-17 DIAGNOSIS — Z981 Arthrodesis status: Secondary | ICD-10-CM | POA: Insufficient documentation

## 2015-06-17 DIAGNOSIS — M2580 Other specified joint disorders, unspecified joint: Secondary | ICD-10-CM | POA: Insufficient documentation

## 2015-06-17 DIAGNOSIS — M533 Sacrococcygeal disorders, not elsewhere classified: Secondary | ICD-10-CM

## 2015-06-17 DIAGNOSIS — Z9889 Other specified postprocedural states: Secondary | ICD-10-CM | POA: Insufficient documentation

## 2015-06-17 DIAGNOSIS — G8929 Other chronic pain: Secondary | ICD-10-CM

## 2015-06-17 DIAGNOSIS — M25552 Pain in left hip: Secondary | ICD-10-CM | POA: Diagnosis present

## 2015-06-17 MED ORDER — TRAMADOL HCL 50 MG PO TABS: 50.0000 mg | ORAL_TABLET | Freq: Two times a day (BID) | ORAL | Status: DC | PRN

## 2015-06-17 NOTE — Progress Notes (Signed)
Subjective:    Patient ID: Anthony [name], male    DOB: [birth year], 55 y.o.   MRN: [id number]  HPI 55 year old male with history of right hip osteoarthritis status post hip replacement. Postoperatively he had continued pain in the buttocks area. He underwent sacroiliac injections followed by sacroiliac RF which helped to resolve his complaints. More recently he is developed left-sided buttock pain, Very similar symptoms to the right side., he underwent Left sacroiliac radiofrequency Neurotomy one month ago.  While he had some minor improvement in his symptoms it did not have the dramatic relief of the prior procedure. He is here to discuss his options and get further evaluation  He is really not using any prescription medication for his pain other than gabapentin which she can only tolerate at night. He has tried small dose of tramadol the past which were helpful for him. He does not get much relief with over-the-counter analgesics such as Tylenol or Advil  He has no numbness or tingling into left lower extremity. No weakness in the left lower extremity. He does notice stiffness in the left hip. 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   Pain Inventory Average Pain 4 Pain Right Now 4 My pain is sharp  In the last 24 hours, has pain interfered with the following? General activity 3 Relation with others 1 Enjoyment of life 3 What TIME of day is your pain at its worst? morning, evening Sleep (in general) Fair  Pain is worse with: inactivity and standing Pain improves with: heat/ice, therapy/exercise and medication Relief from Meds: 7  Mobility walk without assistance ability to climb steps?  yes do you drive?  yes Do you have any goals in this area?  no  Function employed # of hrs/week 45 what is your job? [employer] service Do you have any goals in this area?   no  Neuro/Psych bowel control problems  Prior Studies Any changes since last visit?  no  Physicians involved in your care Any changes since last visit?  no   History reviewed. No pertinent family history. Social History   Social History  . Marital Status: Single    Spouse Name: N/A  . Number of Children: N/A  . Years of Education: N/A   Social History Main Topics  . Smoking status: Never Smoker   . Smokeless tobacco: Never Used  . Alcohol Use: No  . Drug Use: No  . Sexual Activity: Not Asked   Other Topics Concern  . None   Social History Narrative   Past Surgical History  Procedure Laterality Date  . Joint replacement Right jan 2009  . Spinal fusion  May 31, 2013    C 1 to T 1 at [facility]  . Appendectomy  oct 2014  . Knee arthroscopy Right 07/27/2013    Procedure: RIGHT ARTHROSCOPY KNEE WITH MEDIAL MENSICUS DEBRIDEMENT;  Surgeon: Loanne DrillingFrank V Aluisio, MD;  Location: [facility];  Service: Orthopedics;  Laterality: Right;   Past Medical History  Diagnosis Date  . Hypertension   . GERD (gastroesophageal reflux disease)   . PONV (postoperative nausea and vomiting)     occasional nausea, no vomiting  . Broken rib 07/24/2013    s/p fall   BP 117/76 mmHg  Pulse 79  Resp 14  SpO2 96%  Opioid Risk Score:   Fall Risk Score:  `1  Depression screen PHQ 2/9  Depression screen PHQ 2/9 04/22/2015  Decreased Interest 0  Down, Depressed, Hopeless 0  PHQ - 2 Score 0  Altered sleeping 3  Tired, decreased energy 1  Change in appetite 1  Feeling bad or failure about yourself  0  Trouble concentrating 0  Moving slowly or fidgety/restless 0  Suicidal thoughts 0  PHQ-9 Score 5  Difficult doing work/chores Not difficult at all     Review of Systems  All other systems reviewed and are negative.      Objective:   Physical Exam  Constitutional: He is oriented to person, place, and time. He appears well-developed and well-nourished.  HENT:  Head: Normocephalic and  atraumatic.  Eyes: Conjunctivae and EOM are normal. Pupils are equal, round, and reactive to light.  Musculoskeletal:       Left hip: He exhibits decreased range of motion. He exhibits no bony tenderness and no deformity.       Lumbar back: He exhibits tenderness. He exhibits normal range of motion and no deformity.  There is decreased range of motion at the left hip with internal/external rotation. No tenderness over the greater trochanter.  Neurological: He is alert and oriented to person, place, and time. He exhibits normal muscle tone. Coordination normal.  Motor strength is 5/5 bilateral hip flexion and extensor dorsal flexor Negative straight leg raising Tenderness over left PSIS area   Psychiatric: He has a normal mood and affect.  Nursing note and vitals reviewed.         Assessment & Plan:  1. Left buttock pain with partial relief using left sacroiliac radiofrequency. The left side appears to be at least one of the pain generators. We discussed that because of his left hip contracture his gait alteration may be contributing to some sacroiliac pain. We discussed it would be worthwhile to further evaluate left hip Contracture. We'll order x-ray. If the left hip x-ray does not show any severe  Osteoarthritis may benefit from left sacroiliac injection to see if this helps relieve pain. We discussed that the innervation of the left sacroiliac may be different than the right side. We discussed the possibility of ventral root innervation of the joint.  We discussed other treatment options including cold RF as well as sacroiliac fusion.  Answered questions, return to clinic in about 4 weeks Will prescribe tramadol 50 mg twice a day until that time on chair for sedation

## 2015-06-17 NOTE — Patient Instructions (Signed)
Acetaminophen; Tramadol tablets What is this medicine? ACETAMINOPHEN; TRAMADOL (a set a MEE noe fen; TRA ma dole) is a pain reliever. It is used to treat short term moderate pain. This medicine may be used for other purposes; ask your health care provider or pharmacist if you have questions. What should I tell my health care provider before I take this medicine? They need to know if you have any of these conditions: -brain tumor -depression -drug abuse or addiction -head injury -if you often drink alcohol -kidney disease or trouble passing urine -liver disease -lung disease, asthma, or breathing problems -seizures or epilepsy -suicidal thoughts, plans, or attempt; a previous suicide attempt by you or a family member -an unusual or allergic reaction to acetaminophen, tramadol, codeine, other opioid analgesics, other medicines, foods, dyes, or preservatives -pregnant or trying to get pregnant -breast-feeding How should I use this medicine? Take this medicine by mouth with a full glass of water. Follow the directions on the prescription label. If the medicine upsets your stomach, take it with food or milk. Do not take more than you are told to take. Talk to your pediatrician regarding the use of this medicine in children. Special care may be needed. Overdosage: If you think you have taken too much of this medicine contact a poison control center or emergency room at once. NOTE: This medicine is only for you. Do not share this medicine with others. What if I miss a dose? If you miss a dose, take it as soon as you can. If it is almost time for your next dose, take only that dose. Do not take double or extra doses. What may interact with this medicine? -alcohol or medicines that contain alcohol -antihistamines -bupropion -carbamazepine or oxcarbazepine -clozapine -cyclobenazeprine -digoxin -furazolidone -isoniazid -linezolid -medicines for depression, anxiety, or psychotic  disturbances -medicines for pain including pentazocine, buprenorphine, butorphanol, nalbuphine, tramadol, and propoxyphene -medicines for sleep -muscle relaxants -naltrexone -phenobarbital -procarbazine -warfarin This list may not describe all possible interactions. Give your health care provider a list of all the medicines, herbs, non-prescription drugs, or dietary supplements you use. Also tell them if you smoke, drink alcohol, or use illegal drugs. Some items may interact with your medicine. What should I watch for while using this medicine? Tell your doctor or health care professional if your pain does not go away, if it gets worse, or if you have new or a different type of pain. You may get drowsy or dizzy when you first start taking the medicine or change doses. Do not drive, use machinery, or do anything that may be dangerous until you know how the medicine affects you. Stand or sit up slowly. Too much acetaminophen can be very dangerous. Do not take Tylenol (acetaminophen) or medicines that contain acetaminophen with this medicine. Many non-prescription medicines contain acetaminophen. Always read the labels carefully. What side effects may I notice from receiving this medicine? Side effects that you should report to your doctor or health care professional as soon as possible: -allergic reactions like skin rash, itching or hives, swelling of the face, lips, or tongue -breathing problems -confusion -feeling faint or lightheaded, falls -redness, blistering, peeling or loosening of the skin, including inside the mouth -seizures -stomach pain -yellowing of the eyes or skin Side effects that usually do not require medical attention (report to your doctor or health care professional if they continue or are bothersome): -constipation -diarrhea -nausea, vomiting -stomach upset -sweating This list may not describe all possible side effects. Call your  doctor for medical advice about side  effects. You may report side effects to FDA at 1-800-FDA-1088. Where should I keep my medicine? Keep out of the reach of children. Tramadol is a morphine-like drug that can be abused. Keep your medicine in a safe place to protect it from theft. Do not share this medicine with anyone. Selling or giving away this medicine is dangerous and is against the law. This medicine may cause accidental overdose and death if it taken by other adults, children, or pets. Mix any unused medicine with a substance like cat litter or coffee grounds. Then throw the medicine away in a sealed container like a sealed bag or a coffee can with a lid. Do not use the medicine after the expiration date. Store at room temperature between 15 and 30 degrees C (59 and 86 degrees F). NOTE: This sheet is a summary. It may not cover all possible information. If you have questions about this medicine, talk to your doctor, pharmacist, or health care provider.    2016, Elsevier/Gold Standard. (2013-05-19 15:10:59)

## 2015-07-02 ENCOUNTER — Ambulatory Visit (HOSPITAL_BASED_OUTPATIENT_CLINIC_OR_DEPARTMENT_OTHER)
Admission: RE | Admit: 2015-07-02 | Discharge: 2015-07-02 | Disposition: A | Discharge status: Home / Self Care | Payer: Federal, State, Local not specified - PPO | Source: Ambulatory Visit | Attending: Physical Medicine & Rehabilitation | Admitting: Physical Medicine & Rehabilitation

## 2015-07-02 DIAGNOSIS — M47896 Other spondylosis, lumbar region: Secondary | ICD-10-CM | POA: Insufficient documentation

## 2015-07-02 DIAGNOSIS — G8929 Other chronic pain: Secondary | ICD-10-CM | POA: Insufficient documentation

## 2015-07-02 DIAGNOSIS — M533 Sacrococcygeal disorders, not elsewhere classified: Secondary | ICD-10-CM | POA: Diagnosis present

## 2015-07-07 ENCOUNTER — Encounter: Payer: Self-pay | Admitting: Physical Medicine & Rehabilitation

## 2015-07-22 ENCOUNTER — Encounter: Payer: Federal, State, Local not specified - PPO | Attending: Physical Medicine & Rehabilitation

## 2015-07-22 ENCOUNTER — Encounter: Payer: Self-pay | Admitting: Physical Medicine & Rehabilitation

## 2015-07-22 ENCOUNTER — Ambulatory Visit (HOSPITAL_BASED_OUTPATIENT_CLINIC_OR_DEPARTMENT_OTHER): Payer: Federal, State, Local not specified - PPO | Admitting: Physical Medicine & Rehabilitation

## 2015-07-22 VITALS — BP 132/67 | HR 70 | Resp 14

## 2015-07-22 DIAGNOSIS — M5416 Radiculopathy, lumbar region: Secondary | ICD-10-CM | POA: Diagnosis not present

## 2015-07-22 DIAGNOSIS — M533 Sacrococcygeal disorders, not elsewhere classified: Secondary | ICD-10-CM | POA: Diagnosis not present

## 2015-07-22 DIAGNOSIS — Z9889 Other specified postprocedural states: Secondary | ICD-10-CM | POA: Insufficient documentation

## 2015-07-22 DIAGNOSIS — M79672 Pain in left foot: Secondary | ICD-10-CM | POA: Insufficient documentation

## 2015-07-22 DIAGNOSIS — M545 Low back pain: Secondary | ICD-10-CM | POA: Insufficient documentation

## 2015-07-22 DIAGNOSIS — K219 Gastro-esophageal reflux disease without esophagitis: Secondary | ICD-10-CM | POA: Diagnosis not present

## 2015-07-22 DIAGNOSIS — M2580 Other specified joint disorders, unspecified joint: Secondary | ICD-10-CM | POA: Diagnosis not present

## 2015-07-22 DIAGNOSIS — Z981 Arthrodesis status: Secondary | ICD-10-CM | POA: Insufficient documentation

## 2015-07-22 DIAGNOSIS — I10 Essential (primary) hypertension: Secondary | ICD-10-CM | POA: Diagnosis not present

## 2015-07-22 DIAGNOSIS — M25552 Pain in left hip: Secondary | ICD-10-CM | POA: Diagnosis present

## 2015-07-22 NOTE — Patient Instructions (Signed)
I suspect you have multiple pain generators. We'll schedule you back for lumbar injection

## 2015-07-22 NOTE — Progress Notes (Signed)
Subjective:    Patient ID: Anthony [name], male    DOB: 1960-12-21, 55 y.o.   MRN: [id number]  HPI 55 year old male with long history of low back and hip pain. He underwent a right hip replacement several years ago which helped some with groin pain but did not help with buttock and lateral hip pain.  He was evaluated in this clinic and I right sacroiliac injection was helpful in alleviating his symptoms short-term this is followed by a long-term relief following a right sacroiliac radiofrequency procedure  He returned after several years with similar symptoms on the left side except that he does not have significant groin pain. He does have buttock pain on the left side. He had good pain reliefWith left sacroiliac injection. He underwent a left L5-S1 S2-S3 dorsal ramus injection as well as  Left L4 medial branch injection with RFA, 05/24/2015, this unfortunately was not very helpful for his symptoms he returns today for follow-up. In addition to his left-sided buttocks pain, he has pain that radiates from the buttocks into the left posterior thigh and into the left calf and leg area. No pain into the foot. He has occasional numbness and tingling in this distribution as well but it is not constant.  Left hip x-ray showed mild to moderate osteoarthritis, superior acetabular osteophyte, there was loss of disc space at L5-S1 as well as large lumbar vertebral body osteophytes.  Pain Inventory Average Pain 4 Pain Right Now 4 My pain is sharp and burning  In the last 24 hours, has pain interfered with the following? General activity 3 Relation with others 0 Enjoyment of life 1 What TIME of day is your pain at its worst? morning Sleep (in general) Fair  Pain is worse with: walking and standing Pain improves with: medication, TENS and injections Relief from Meds: 8  Mobility walk without assistance how many minutes can you walk? 30 Do you have any goals in this area?  no  Function Do you  have any goals in this area?  no  Neuro/Psych No problems in this area  Prior Studies Any changes since last visit?  no  Physicians involved in your care Any changes since last visit?  no   History reviewed. No pertinent family history. Social History   Social History  . Marital Status: Single    Spouse Name: N/A  . Number of Children: N/A  . Years of Education: N/A   Social History Main Topics  . Smoking status: Never Smoker   . Smokeless tobacco: Never Used  . Alcohol Use: No  . Drug Use: No  . Sexual Activity: Not Asked   Other Topics Concern  . None   Social History Narrative   Past Surgical History  Procedure Laterality Date  . Joint replacement Right jan 2009  . Spinal fusion  May 31, 2013    C 1 to T 1 at [facility]  . Appendectomy  oct 2014  . Knee arthroscopy Right 07/27/2013    Procedure: RIGHT ARTHROSCOPY KNEE WITH MEDIAL MENSICUS DEBRIDEMENT;  Surgeon: Loanne Drilling, MD;  Location: [facility];  Service: Orthopedics;  Laterality: Right;   Past Medical History  Diagnosis Date  . Hypertension   . GERD (gastroesophageal reflux disease)   . PONV (postoperative nausea and vomiting)     occasional nausea, no vomiting  . Broken rib 07/24/2013    s/p fall   BP 132/67 mmHg  Pulse 70  Resp 14  SpO2 96%  Opioid Risk Score:  Fall Risk Score:  `1  Depression screen PHQ 2/9  Depression screen PHQ 2/9 04/22/2015  Decreased Interest 0  Down, Depressed, Hopeless 0  PHQ - 2 Score 0  Altered sleeping 3  Tired, decreased energy 1  Change in appetite 1  Feeling bad or failure about yourself  0  Trouble concentrating 0  Moving slowly or fidgety/restless 0  Suicidal thoughts 0  PHQ-9 Score 5  Difficult doing work/chores Not difficult at all     Review of Systems  All other systems reviewed and are negative.      Objective:   Physical Exam  Constitutional: He is oriented to person, place, and time. He appears well-developed and well-nourished.  HENT:    Head: Normocephalic and atraumatic.  Eyes: Conjunctivae and EOM are normal. Pupils are equal, round, and reactive to light.  Neck: Normal range of motion.  Musculoskeletal:       Lumbar back: He exhibits decreased range of motion and pain.  Neurological: He is alert and oriented to person, place, and time.  Psychiatric: He has a normal mood and affect.  Nursing note and vitals reviewed.  No pain with left hip range of motion mild reduction in range of motion. Negative straight leg raising test Ambulates without evidence of toe drag or knee instability       Assessment & Plan:  1. Left buttock pain which appears to be related to sacroiliac joint. Unfortunately he did not get the same degree of relief with the left sacroiliac are of a as he did with the right sacroiliac RFA. No clearcut reasons for this.  We discussed treatment options including cold RFA which would have to be performed at another clinic that has that equipment. We also discussed referral to orthopedic spine surgeon to evaluate for left sacroiliac fusion, he would like to proceed with this option.  2. Left lower extremity pain which by history sounds to be radicular, imaging studies are incomplete but do demonstrate loss of disc space at L4-L5, this pain would not be explained by the sacroiliac joint. Would recommend lumbar MRI to further evaluate. This would be followed by lumbar epidural injection  Over half of the 25 min visit was spent counseling and coordinating care.

## 2015-07-28 ENCOUNTER — Ambulatory Visit
Admission: RE | Admit: 2015-07-28 | Discharge: 2015-07-28 | Disposition: A | Discharge status: Home / Self Care | Payer: Federal, State, Local not specified - PPO | Source: Ambulatory Visit | Attending: Physical Medicine & Rehabilitation | Admitting: Physical Medicine & Rehabilitation

## 2015-07-28 DIAGNOSIS — M5416 Radiculopathy, lumbar region: Secondary | ICD-10-CM

## 2015-08-12 ENCOUNTER — Encounter: Payer: Federal, State, Local not specified - PPO | Attending: Physical Medicine & Rehabilitation

## 2015-08-12 ENCOUNTER — Ambulatory Visit (HOSPITAL_BASED_OUTPATIENT_CLINIC_OR_DEPARTMENT_OTHER): Payer: Federal, State, Local not specified - PPO | Admitting: Physical Medicine & Rehabilitation

## 2015-08-12 ENCOUNTER — Encounter: Payer: Self-pay | Admitting: Physical Medicine & Rehabilitation

## 2015-08-12 VITALS — BP 133/67 | HR 68 | Resp 16

## 2015-08-12 DIAGNOSIS — M545 Low back pain: Secondary | ICD-10-CM | POA: Diagnosis present

## 2015-08-12 DIAGNOSIS — M79672 Pain in left foot: Secondary | ICD-10-CM | POA: Insufficient documentation

## 2015-08-12 DIAGNOSIS — M2580 Other specified joint disorders, unspecified joint: Secondary | ICD-10-CM | POA: Insufficient documentation

## 2015-08-12 DIAGNOSIS — I10 Essential (primary) hypertension: Secondary | ICD-10-CM | POA: Insufficient documentation

## 2015-08-12 DIAGNOSIS — Z9889 Other specified postprocedural states: Secondary | ICD-10-CM | POA: Insufficient documentation

## 2015-08-12 DIAGNOSIS — M25552 Pain in left hip: Secondary | ICD-10-CM | POA: Diagnosis present

## 2015-08-12 DIAGNOSIS — K219 Gastro-esophageal reflux disease without esophagitis: Secondary | ICD-10-CM | POA: Insufficient documentation

## 2015-08-12 DIAGNOSIS — M533 Sacrococcygeal disorders, not elsewhere classified: Secondary | ICD-10-CM

## 2015-08-12 DIAGNOSIS — M5416 Radiculopathy, lumbar region: Secondary | ICD-10-CM | POA: Diagnosis not present

## 2015-08-12 DIAGNOSIS — Z981 Arthrodesis status: Secondary | ICD-10-CM | POA: Insufficient documentation

## 2015-08-12 DIAGNOSIS — G8929 Other chronic pain: Secondary | ICD-10-CM

## 2015-08-12 MED ORDER — TRAMADOL HCL 50 MG PO TABS: 50.0000 mg | ORAL_TABLET | Freq: Two times a day (BID) | ORAL | Status: DC | PRN

## 2015-08-12 NOTE — Progress Notes (Signed)
Lumbar Left L4-5 transforaminal epidural steroid injection under fluoroscopic guidance  Indication: Lumbosacral radiculitis is not relieved by medication management or other conservative care and interfering with self-care and mobility.   Informed consent was obtained after describing risk and benefits of the procedure with the patient, this includes bleeding, bruising, infection, paralysis and medication side effects.  The patient wishes to proceed and has given written consent.  Patient was placed in prone position.  The lumbar area was marked and prepped with Betadine.  It was entered with a 25-gauge 1-1/2 inch needle and one mL of 1% lidocaine was injected into the skin and subcutaneous tissue.  Then a 22-gauge 3.5 inch spinal needle was inserted into the Left L4-5 intervertebral foramen under AP, lateral, and oblique view.  Then a solution containing one mL of 10 mg per mL dexamethasone and 2 mL of 1% lidocaine was injected.  The patient tolerated procedure well.  Post procedure instructions were given.  Please see post procedure form.  Patient felt numbness in the distribution of his usual pain  Reviewed referral for left sacroiliac injection with anesthetic only requested by Orthopedic spine surgeon

## 2015-08-12 NOTE — Progress Notes (Signed)
  PROCEDURE RECORD [facility]   Name: Anthony CroftDon [name] DOB:Apr 16, 1960 MRN: [id number]  Date:08/12/2015  Physician: Claudette LawsAndrew Kirsteins, MD    Nurse/CMA: Purvis SheffieldKen Ryonna Cimini, CMA  Allergies:  Allergies  Allergen Reactions  . Penicillins Anaphylaxis  . Brassica Oleracea Italica Nausea And Vomiting    broccoli    Consent Signed: Yes.    Is patient diabetic? No.  CBG today? N/A  Pregnant: No. LMP: No LMP for male patient. (age 57-55)  Anticoagulants: no Anti-inflammatory: no Antibiotics: no  Procedure: left transforaminal epidural steroid injection  Position: Prone Start Time: 12:16pm  End Time: 12:22pm  Fluoro Time: 29  RN/CMA Lyn Deemer,CMA Sherlyn Ebbert,CMA    Time 12:00pm 12:26pm    BP 133/67 123/70    Pulse 68 71    Respirations 16 16    O2 Sat 96 95    S/S 6 6    Pain Level 5/10 1/10     D/C home with Anthony FellingCarla (wife), patient A & O X 3, D/C instructions reviewed, and sits independently.

## 2015-08-12 NOTE — Patient Instructions (Signed)

## 2015-08-22 ENCOUNTER — Encounter: Payer: Self-pay | Admitting: Physical Medicine & Rehabilitation

## 2015-08-26 ENCOUNTER — Ambulatory Visit: Payer: Federal, State, Local not specified - PPO | Admitting: Physical Medicine & Rehabilitation

## 2015-08-27 ENCOUNTER — Encounter: Payer: Self-pay | Admitting: Physical Medicine & Rehabilitation

## 2015-08-27 ENCOUNTER — Ambulatory Visit (HOSPITAL_BASED_OUTPATIENT_CLINIC_OR_DEPARTMENT_OTHER): Payer: Federal, State, Local not specified - PPO | Admitting: Physical Medicine & Rehabilitation

## 2015-08-27 VITALS — BP 116/63 | HR 75 | Resp 14

## 2015-08-27 DIAGNOSIS — M2580 Other specified joint disorders, unspecified joint: Secondary | ICD-10-CM | POA: Diagnosis not present

## 2015-08-27 DIAGNOSIS — M533 Sacrococcygeal disorders, not elsewhere classified: Secondary | ICD-10-CM | POA: Diagnosis not present

## 2015-08-27 NOTE — Patient Instructions (Addendum)
Sacroiliac injection was performed today. Marcaine only The injection was done under x-ray guidance. This procedure has been performed to help reduce low back and buttocks pain as well as potentially hip pain. The duration of this injection is variable lasting from hours to  Months. It may repeated if needed.

## 2015-08-27 NOTE — Progress Notes (Signed)
   Subjective:    Patient ID: Anthony [name], male    DOB: June 30, 1960, 55 y.o.   MRN: [id number]  HPI      Review of Systems     Objective:   Physical Exam        Assessment & Plan:    PROCEDURE RECORD [facility]   Name: Anthony [name] DOB:June 30, 1960 MRN: [id number]  Date:08/27/2015  Physician: Claudette LawsAndrew Kirsteins, MD    Nurse/CMA: Colon Rueth, CMA  Allergies:  Allergies  Allergen Reactions  . Penicillins Anaphylaxis  . Brassica Oleracea Italica Nausea And Vomiting    broccoli    Consent Signed: Yes.    Is patient diabetic? No.  CBG today?   Pregnant: No. LMP: No LMP for male patient. (age 55-55)  Anticoagulants: no Anti-inflammatory: no Antibiotics: no  Procedure: left sacroiliac non-steroid injection  Position: Prone Start Time: 1:22pm  End Time: 1:26pm       Fluoro Time: 8  RN/CMA Aleni Andrus, CMA Tomara Youngberg, CMA    Time 1:10pm 1:32pm    BP 116/63 130/60    Pulse 75 70    Respirations 14 14    O2 Sat 95 97    S/S 6 6    Pain Level 4/10 0/10     D/C home with wife, patient A & O X 3, D/C instructions reviewed, and sits independently.

## 2015-08-27 NOTE — Progress Notes (Signed)
Left sacroiliac injection under fluoroscopic guidance  Indication: Left Low back and buttocks pain not relieved by medication management and other conservative care.  Informed consent was obtained after describing risks and benefits of the procedure with the patient, this includes bleeding, bruising, infection, paralysis and medication side effects. The patient wishes to proceed and has given written consent. The patient was placed in a prone position. The lumbar and sacral area was marked and prepped with Betadine. A 25-gauge 1-1/2 inch needle was inserted into the skin and subcutaneous tissue and 1 mL of 1% lidocaine was injected. Then a 25-gauge 3 inch spinal needle was inserted under fluoroscopic guidance into the left sacroiliac joint. AP and lateral images were utilized. Omnipaque 180x0.5 mL under live fluoroscopy demonstrated no intravascular uptake. Then  2% lidocaine MPF was injected x1.5 mL. Marland Kitchen. Post procedure instructions were given. Please see post procedure form. Postprocedure the patient complained of some numbness in the left leg. Patient was asked to sit for 20-30 minutes. This is likely secondary to either lidocaine causing some local anesthesia in the region of the sciatic nerve versus neurogenic claudication with patient in a prone position given his multilevel severe spinal stenosis Episode resolved after sitting, patient was able to ambulate without difficulty, wife drove him back home

## 2015-09-23 ENCOUNTER — Encounter: Payer: Self-pay | Admitting: Physical Medicine & Rehabilitation

## 2015-09-23 ENCOUNTER — Ambulatory Visit (HOSPITAL_BASED_OUTPATIENT_CLINIC_OR_DEPARTMENT_OTHER): Payer: Federal, State, Local not specified - PPO | Admitting: Physical Medicine & Rehabilitation

## 2015-09-23 ENCOUNTER — Encounter: Payer: Federal, State, Local not specified - PPO | Attending: Physical Medicine & Rehabilitation

## 2015-09-23 VITALS — BP 135/95 | HR 69 | Resp 17

## 2015-09-23 DIAGNOSIS — M533 Sacrococcygeal disorders, not elsewhere classified: Secondary | ICD-10-CM

## 2015-09-23 DIAGNOSIS — I10 Essential (primary) hypertension: Secondary | ICD-10-CM | POA: Insufficient documentation

## 2015-09-23 DIAGNOSIS — M5416 Radiculopathy, lumbar region: Secondary | ICD-10-CM | POA: Diagnosis not present

## 2015-09-23 DIAGNOSIS — M545 Low back pain: Secondary | ICD-10-CM | POA: Diagnosis present

## 2015-09-23 DIAGNOSIS — M79672 Pain in left foot: Secondary | ICD-10-CM | POA: Diagnosis present

## 2015-09-23 DIAGNOSIS — M2580 Other specified joint disorders, unspecified joint: Secondary | ICD-10-CM | POA: Diagnosis not present

## 2015-09-23 DIAGNOSIS — G8929 Other chronic pain: Secondary | ICD-10-CM | POA: Diagnosis not present

## 2015-09-23 DIAGNOSIS — K219 Gastro-esophageal reflux disease without esophagitis: Secondary | ICD-10-CM | POA: Diagnosis not present

## 2015-09-23 DIAGNOSIS — Z9889 Other specified postprocedural states: Secondary | ICD-10-CM | POA: Insufficient documentation

## 2015-09-23 DIAGNOSIS — M25552 Pain in left hip: Secondary | ICD-10-CM | POA: Diagnosis present

## 2015-09-23 DIAGNOSIS — Z981 Arthrodesis status: Secondary | ICD-10-CM | POA: Diagnosis not present

## 2015-09-23 NOTE — Patient Instructions (Signed)
If left leg and foot pain recur, call to schedule left L4-L5 transforaminal epidural steroid injection

## 2015-09-23 NOTE — Progress Notes (Signed)
Subjective:    Patient ID: Anthony [name], male    DOB: April 11, 1960, 55 y.o.   MRN: [id number]  HPI  Left L4-L5 epidural 08/12/2015 was very helpful. There is still continued benefit.No further pain shooting down to the foot.  Sacroiliac injection was also helpful On 08/27/2015 to help relieve the left sacroiliac pain Patient has seen both Dr. Jeanette Caprice at [facility] orthopedic who discussed anterior and posterior 360 fusion.Patient was somewhat apprehensive about the anterior approach and the potential risks.  Also seen by Dr. Lianne Cure at [facility] who recommended posterior fusion during conversation.This was actually a follow-up to the C2-T1 cervical fusion. The patient discussed his lumbar spine at that time as well with this was not documented in the M.D. Note however.   Pain Inventory Average Pain 4 Pain Right Now 1 My pain is sharp and stabbing  In the last 24 hours, has pain interfered with the following? General activity 4 Relation with others 2 Enjoyment of life 3 What TIME of day is your pain at its worst? morning Sleep (in general) Poor  Pain is worse with: walking, bending, sitting and inactivity Pain improves with: therapy/exercise, medication, TENS and injections Relief from Meds: NA  Mobility walk without assistance how many minutes can you walk? 30 ability to climb steps?  yes Do you have any goals in this area?  no  Function employed # of hrs/week 45 Do you have any goals in this area?  no  Neuro/Psych numbness trouble walking  Prior Studies Any changes since last visit?  no MRI LUMBAR SPINE WITHOUT CONTRAST  TECHNIQUE: Multiplanar, multisequence MR imaging of the lumbar spine was performed. No intravenous contrast was administered.  COMPARISON: CT abdomen and pelvis 01/08/2013.  FINDINGS: Vertebral body height is maintained. 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 There is also  trace retrolisthesis L1 on L2. Alignment is otherwise maintained. The L4-5 level is fused. The patient has a congenitally narrow central spinal canal. Degenerative endplate signal change appears worst at L1-2 and L5-S1. The conus medullaris is normal in signal and position. Imaged intra-abdominal contents are unremarkable. A defect in the posterior left iliac wing with central T2 hyperintensity and T1 hypointensity may be related to prior bone graft harvest. The lesion is well circumscribed without cortical break most consistent with benignity.  T11-12: Facet degenerative change and a shallow disc bulge. The central canal is mildly narrowed. The foramina are open.  T12-L1: Down turning disc bulge eccentric to the right causes mild to moderate central canal narrowing and narrowing in the right lateral recess. Mild right foraminal narrowing is seen. The left foramen is open.  L1-2: There is a disc bulge, facet arthropathy and some ligamentum flavum thickening causing moderately severe central canal and lateral recess narrowing bilaterally. Moderately severe right foraminal narrowing is present. The left foramen appears open.  L2-3: Disc bulge, ligamentum flavum thickening and facet arthropathy are seen. There is severe central canal stenosis. The left foramen is open. There is mild right foraminal narrowing.  L4-5: Left much worse than right facet arthropathy, disc with endplate spurring and ligamentum flavum thickening are seen. There is severe central canal stenosis. The right foramen is open. Moderate left foraminal narrowing is identified.  L4-5: The intervertebral disc spaces and facet joints are fused, likely autologous. Endplate spurring is present and there is ligamentum flavum thickening. Moderately severe to severe central canal stenosis is identified. Moderate bilateral foraminal narrowing is seen.  L5-S1:  The disc is uncovered and bulging. The central canal  is widely patent. Anterolisthesis and disc result in severe bilateral foraminal narrowing.  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  Moderately severe to severe congenital and acquired central canal stenosis L4-5. The intervertebral disc space and facet joints are autologously fused at this level.  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 There is severe bilateral foraminal narrowing at this level although the central canal is open.  Moderately severe congenital and acquired central canal stenosis L1-2 where there is also moderately severe right foraminal narrowing.   Electronically Signed  By: Drusilla Kannerhomas Dalessio M.D.  On: 07/28/2015 16:34 Physicians involved in your care Any changes since last visit?  no   History reviewed. No pertinent family history. Social History   Social History  . Marital Status: Single    Spouse Name: N/A  . Number of Children: N/A  . Years of Education: N/A   Social History Main Topics  . Smoking status: Never Smoker   . Smokeless tobacco: Never Used  . Alcohol Use: No  . Drug Use: No  . Sexual Activity: Not Asked   Other Topics Concern  . None   Social History Narrative   Past Surgical History  Procedure Laterality Date  . Joint replacement Right jan 2009  . Spinal fusion  May 31, 2013    C 1 to T 1 at [facility]  . Appendectomy  oct 2014  . Knee arthroscopy Right 07/27/2013    Procedure: RIGHT ARTHROSCOPY KNEE WITH MEDIAL MENSICUS DEBRIDEMENT;  Surgeon: Loanne DrillingFrank V Aluisio, MD;  Location: [facility];  Service: Orthopedics;  Laterality: Right;   Past Medical History  Diagnosis Date  . Hypertension   . GERD (gastroesophageal reflux disease)   . PONV (postoperative nausea and vomiting)     occasional nausea, no vomiting  . Broken rib 07/24/2013    s/p fall   BP 135/95 mmHg  Pulse 69  Resp 17  SpO2 94%  Opioid Risk Score:   Fall Risk Score:   `1  Depression screen PHQ 2/9  Depression screen PHQ 2/9 04/22/2015  Decreased Interest 0  Down, Depressed, Hopeless 0  PHQ - 2 Score 0  Altered sleeping 3  Tired, decreased energy 1  Change in appetite 1  Feeling bad or failure about yourself  0  Trouble concentrating 0  Moving slowly or fidgety/restless 0  Suicidal thoughts 0  PHQ-9 Score 5  Difficult doing work/chores Not difficult at all      Review of Systems  Musculoskeletal: Positive for gait problem.  Neurological: Positive for numbness.  All other systems reviewed and are negative.      Objective:   Physical Exam  Constitutional: He is oriented to person, place, and time. He appears well-developed and well-nourished.  HENT:  Head: Normocephalic and atraumatic.  Eyes: Conjunctivae and EOM are normal. Pupils are equal, round, and reactive to light.  Neck:  Decreased range of motion  Musculoskeletal:       Lumbar back: He exhibits decreased range of motion.  No tenderness palpation along the PSIS no tenderness to palpation along the lumbar paraspinals or the trochanteric bursa area.  75% range with lumbar flexion, extension, lateral bending and rotation  Neurological: He is alert and oriented to person, place, and time. No sensory deficit.  Normal light touch sensation bilateral L3-L4 areas  Motor strength is 5/5 in bilateral hip flexor and knee extensor ankle dorsiflexors.  Gait is without evidence of toe drag or knee instability  Psychiatric: He has a normal mood and affect.  Nursing note and vitals reviewed.         Assessment & Plan:  1. Lumbar spinal stenosis multilevel. Has left L4 radicular symptoms which responded well to left L4-L5 transforaminal epidural steroid injection. We discussed timing for reinjection. We discussed total steroid dose per year.  We can do it as early as August 8. Patient will call to reschedule  2. Sacroiliac dysfunction left. Has had relief with sacroiliac injection  last performed 08/27/2015. He is pursuing left sacroiliac fusion and is awaiting insurance appeal. I do think he would benefit from the procedure.

## 2016-01-02 ENCOUNTER — Other Ambulatory Visit: Payer: Self-pay | Admitting: Physical Medicine & Rehabilitation

## 2016-01-20 ENCOUNTER — Encounter: Payer: Self-pay | Admitting: Physical Medicine & Rehabilitation

## 2016-01-20 ENCOUNTER — Ambulatory Visit (HOSPITAL_BASED_OUTPATIENT_CLINIC_OR_DEPARTMENT_OTHER): Payer: Federal, State, Local not specified - PPO | Admitting: Physical Medicine & Rehabilitation

## 2016-01-20 ENCOUNTER — Encounter: Payer: Federal, State, Local not specified - PPO | Attending: Physical Medicine & Rehabilitation

## 2016-01-20 VITALS — BP 131/88 | HR 77 | Resp 14

## 2016-01-20 DIAGNOSIS — M5416 Radiculopathy, lumbar region: Secondary | ICD-10-CM

## 2016-01-20 NOTE — Progress Notes (Signed)
  PROCEDURE RECORD [facility]   Name: Anthony CroftDon [name] DOB:02/04/1961 MRN: [id number]  Date:01/20/2016  Physician: Claudette LawsAndrew Kirsteins, MD    Nurse/CMA: Wessling, CMA  Allergies:  Allergies  Allergen Reactions  . Penicillins Anaphylaxis  . Brassica Oleracea Italica Nausea And Vomiting    broccoli    Consent Signed: Yes.    Is patient diabetic? No.  CBG today?  Pregnant: No. LMP: No LMP for male patient. (age 57-55)  Anticoagulants: no Anti-inflammatory: no Antibiotics: no  Procedure: Left transforanimal lsteriod injection Position: Prone Start Time: 10:36am      End Time: 10:45am        Fluoro Time:  27  RN/CMA Bing PlumeHaynes, CMA Wessling, CMA    Time 10:17 10:50am    BP 131/88 137/96    Pulse 77 82    Respirations 14 14    O2 Sat 95 95    S/S 6 6    Pain Level 3 0/10     D/C home with Wife, patient A & O X 3, D/C instructions reviewed, and sits independently.

## 2016-01-20 NOTE — Patient Instructions (Signed)

## 2016-04-27 ENCOUNTER — Encounter: Payer: Self-pay | Admitting: Physical Medicine & Rehabilitation

## 2016-04-27 ENCOUNTER — Encounter: Payer: Federal, State, Local not specified - PPO | Attending: Physical Medicine & Rehabilitation

## 2016-04-27 ENCOUNTER — Ambulatory Visit (HOSPITAL_BASED_OUTPATIENT_CLINIC_OR_DEPARTMENT_OTHER): Payer: Federal, State, Local not specified - PPO | Admitting: Physical Medicine & Rehabilitation

## 2016-04-27 VITALS — BP 127/85 | HR 73

## 2016-04-27 DIAGNOSIS — M5416 Radiculopathy, lumbar region: Secondary | ICD-10-CM | POA: Diagnosis not present

## 2016-04-27 NOTE — Patient Instructions (Signed)

## 2016-04-27 NOTE — Progress Notes (Signed)
Left L4-5 Lumbar transforaminal epidural steroid injection under fluoroscopic guidance  Indication: Lumbosacral radiculitis is not relieved by medication management or other conservative care and interfering with self-care and mobility.   Informed consent was obtained after describing risk and benefits of the procedure with the patient, this includes bleeding, bruising, infection, paralysis and medication side effects.  The patient wishes to proceed and has given written consent.  Patient was placed in prone position.  The lumbar area was marked and prepped with Betadine.  It was entered with a 25-gauge 1-1/2 inch needle and one mL of 1% lidocaine was injected into the skin and subcutaneous tissue.  Then a 22-gauge 3.5inch  spinal needle was inserted into the left L4-5  intervertebral foramen under AP, lateral, and oblique view.  Then a solution containing one mL of 10 mg per mL dexamethasone and 2 mL of 1% lidocaine was injected.  The patient tolerated procedure well.  Post procedure instructions were given.  Please see post procedure form. Patient states she had numbness in his usual pain distribution. Postprocedure, but was able to ambulate after follow-up vital signs were performed

## 2016-04-27 NOTE — Progress Notes (Signed)
  PROCEDURE RECORD [facility]   Name: Anthony [name] DOB:[birth year] MRN: [id number]  Date:04/27/2016  Physician: Claudette LawsAndrew Kirsteins, MD    Nurse/CMA: Sherrian Nunnelley CMA  Allergies:  Allergies  Allergen Reactions  . Penicillins Anaphylaxis  . Brassica Oleracea Italica Nausea And Vomiting    broccoli    Consent Signed: Yes.    Is patient diabetic? No.  CBG today?   Pregnant: No. LMP: No LMP for male patient. (age 58-55)  Anticoagulants: no Anti-inflammatory: no Antibiotics: no  Procedure:left L4-5 Transforminal Position: Prone Start Time:1032am  End Time:1040am  Fluoro Time:22  RN/CMA Jadd Gasior CMA Daelen Belvedere CMA    Time 1005 1045    BP 127/85 143/91    Pulse 73 69    Respirations 16 16    O2 Sat 96 96    S/S 6 6    Pain Level 5 0     D/C home with wife Anthony [name], patient A & O X 3, D/C instructions reviewed, and sits independently.

## 2016-10-02 ENCOUNTER — Ambulatory Visit (HOSPITAL_BASED_OUTPATIENT_CLINIC_OR_DEPARTMENT_OTHER): Payer: Federal, State, Local not specified - PPO | Admitting: Physical Medicine & Rehabilitation

## 2016-10-02 ENCOUNTER — Encounter: Payer: Federal, State, Local not specified - PPO | Attending: Physical Medicine & Rehabilitation

## 2016-10-02 ENCOUNTER — Encounter: Payer: Self-pay | Admitting: Physical Medicine & Rehabilitation

## 2016-10-02 VITALS — BP 142/72 | HR 76 | Resp 14

## 2016-10-02 DIAGNOSIS — M5416 Radiculopathy, lumbar region: Secondary | ICD-10-CM | POA: Insufficient documentation

## 2016-10-02 NOTE — Progress Notes (Signed)
Left L4-5 Lumbar transforaminal epidural steroid injection under fluoroscopic guidance  Indication: Lumbosacral radiculitis is not relieved by medication management or other conservative care and interfering with self-care and mobility.   Informed consent was obtained after describing risk and benefits of the procedure with the patient, this includes bleeding, bruising, infection, paralysis and medication side effects.  The patient wishes to proceed and has given written consent.  Patient was placed in prone position.  The lumbar area was marked and prepped with Betadine.  It was entered with a 25-gauge 1-1/2 inch needle and one mL of 1% lidocaine was injected into the skin and subcutaneous tissue.  Then a 22-gauge 3.5inch  spinal needle was inserted into the left L4-5  intervertebral foramen under AP, lateral, and oblique view.  Then a solution containing one mL of 10 mg per mL dexamethasone and 2 mL of 1% lidocaine was injected.  The patient tolerated procedure well.  Post procedure instructions were given.  Please see post procedure form. Patient states she had numbness in his usual pain distribution. Postprocedure, but was able to ambulate after follow-up vital signs were performed

## 2016-10-02 NOTE — Progress Notes (Signed)
  PROCEDURE RECORD  [facility]   Name: Rhunette CroftDon Gassert DOB:[birth year] MRN: [id number]  Date:10/02/2016  Physician: Claudette LawsAndrew Kirsteins, MD    Nurse/CMA: Quilla Freeze, CMA  Allergies:  Allergies  Allergen Reactions  . Penicillins Anaphylaxis  . Brassica Oleracea Italica Nausea And Vomiting    broccoli    Consent Signed: Yes.    Is patient diabetic? No.  CBG today? n/a  Pregnant: No. LMP: No LMP for male patient. (age 8918-53)  Anticoagulants: no Anti-inflammatory: no Antibiotics: no  Procedure: transforaminal epidural steroid injection   Position: Prone Start Time: 3:23pm  End Time: 3:28pm  Fluoro Time: 28  RN/CMA Delante Karapetyan, CMA Mart Colpitts, CMA    Time 3:15 pm 3:36pm    BP 142/72 139/88    Pulse 76 75    Respirations 16 16    O2 Sat 95 96    S/S 6 6    Pain Level 5/10 0/10     D/C home with wife, patient A & O X 3, D/C instructions reviewed, and sits independently.

## 2016-10-02 NOTE — Patient Instructions (Addendum)

## 2017-09-30 IMAGING — CR DG HIP (WITH OR WITHOUT PELVIS) 2-3V*L*
3 series · 3 of 3 positions shown · non-contrast
Comparison: Prior CT report 01/08/2013.

CLINICAL DATA: Left hip pain. No recent injury. Right hip
replacement.

EXAM:
DG HIP (WITH OR WITHOUT PELVIS) 2-3V LEFT

[t pelvis a.p.]
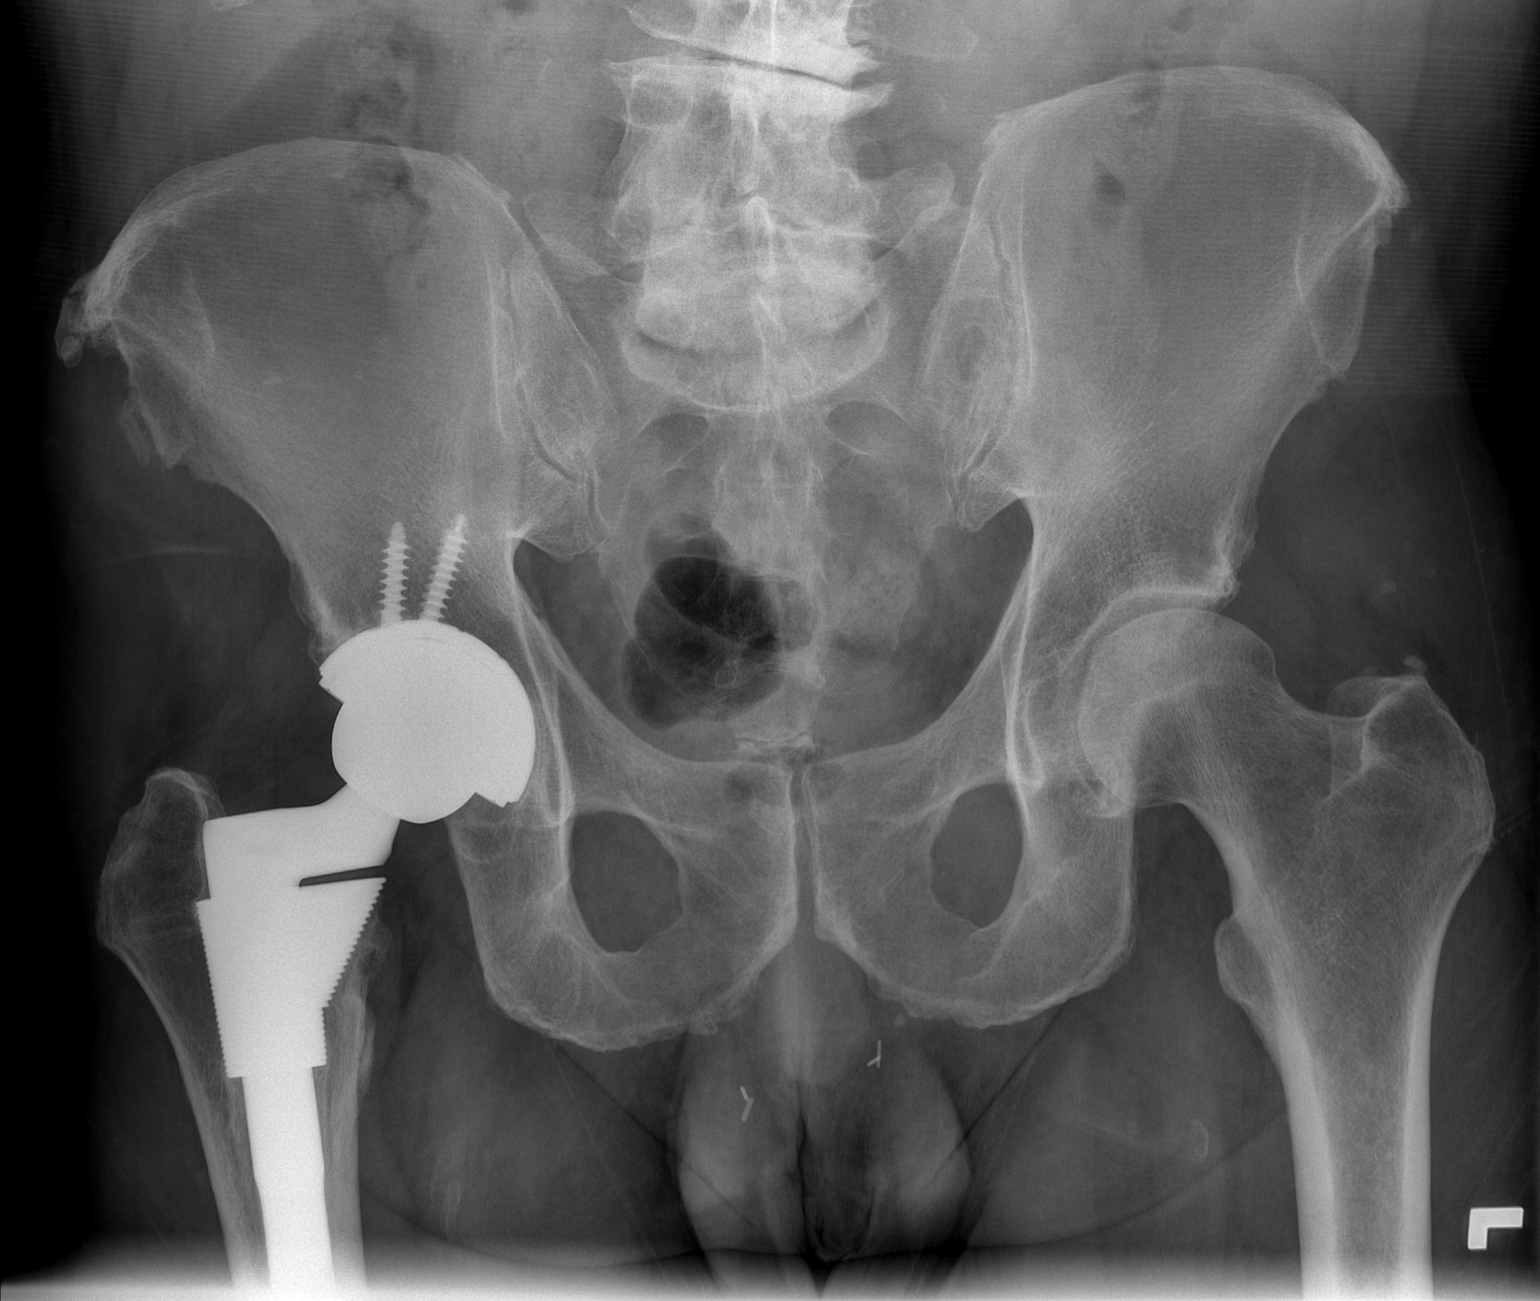

[t hip ap left]
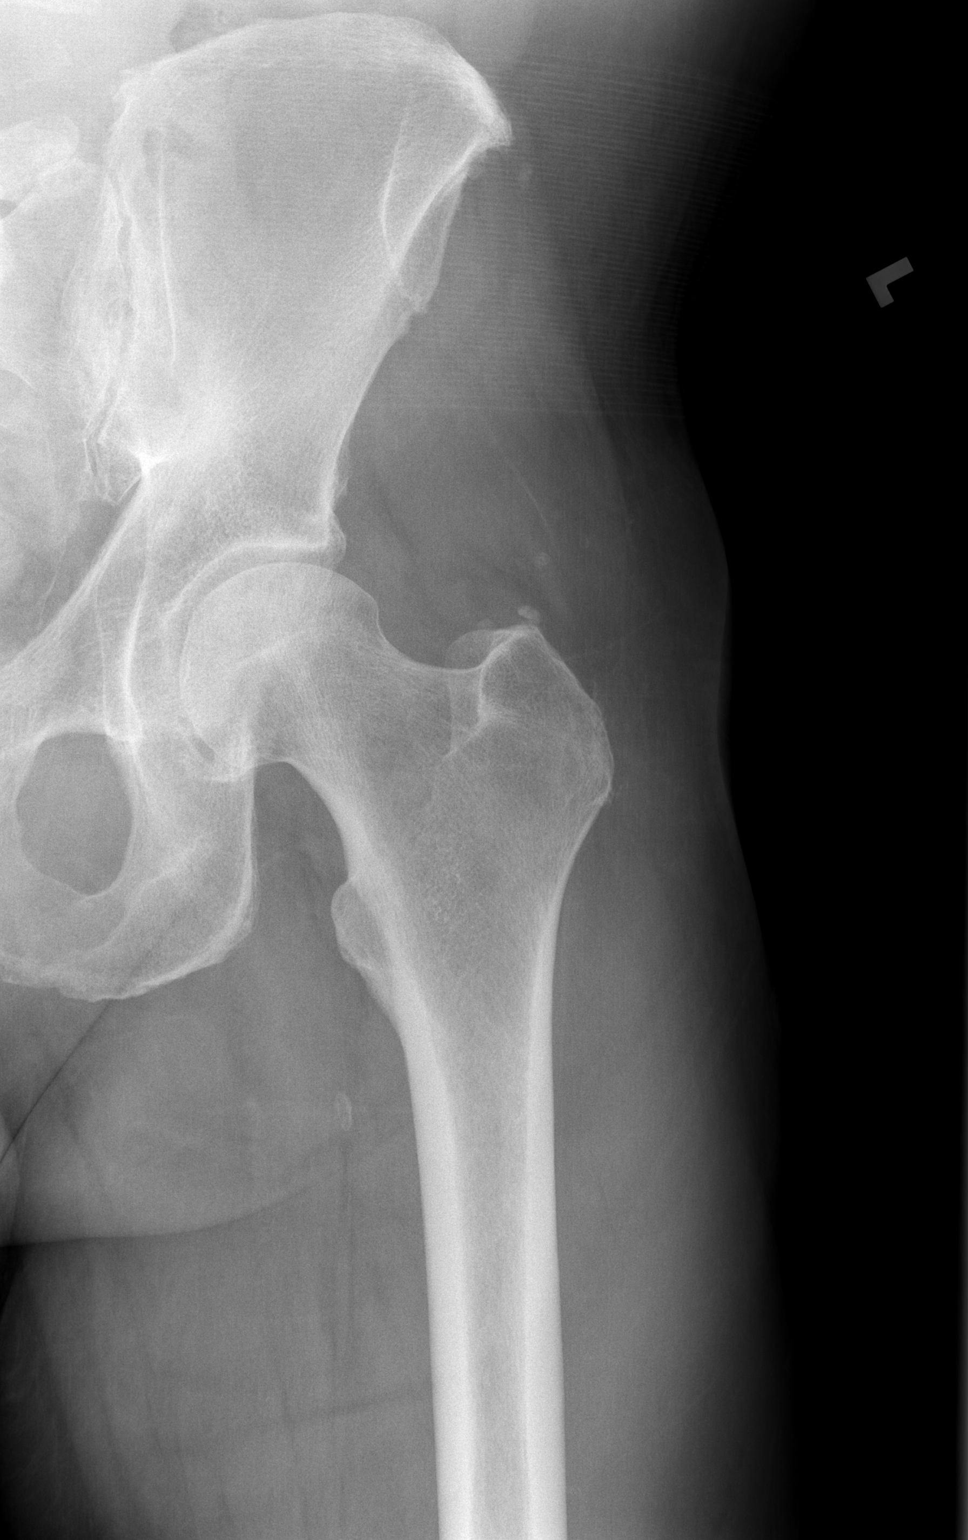

[t hip frog leg left]
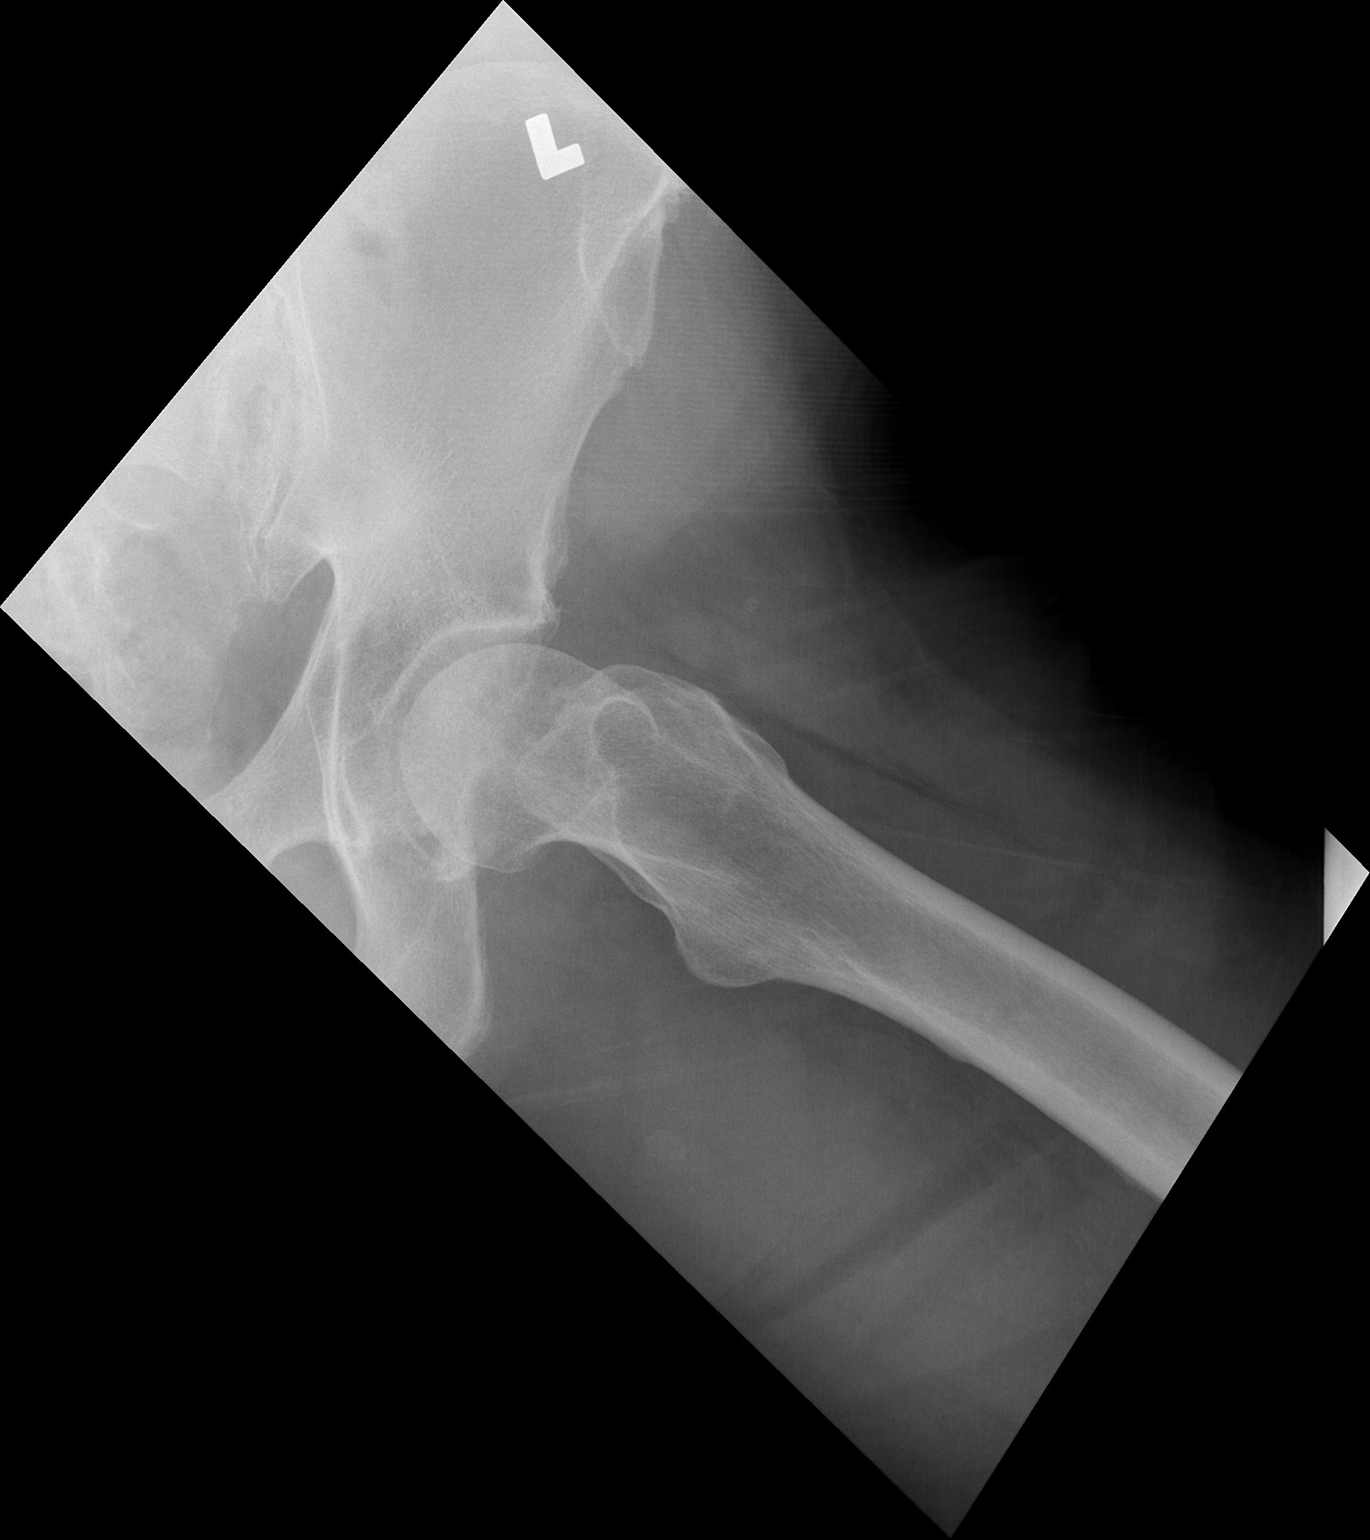

[3 of 3 positions shown; findings below may reference images not displayed]

FINDINGS: Total right hip replacement. Degenerative changes left hip and
lumbar spine. No acute bony abnormality identified. Surgical clips
in the perineum.
IMPRESSION: 1. Total right hip replacement. Hardware intact. Good anatomic
alignment.

2. Degenerative changes lumbar spine . Degenerative changes left
hip. No acute bony or joint abnormality identified.

## 2018-02-09 ENCOUNTER — Encounter: Payer: Self-pay | Admitting: Physical Therapy

## 2018-02-09 ENCOUNTER — Ambulatory Visit
Payer: Federal, State, Local not specified - PPO | Attending: Physical Medicine and Rehabilitation | Admitting: Physical Therapy

## 2018-02-09 ENCOUNTER — Other Ambulatory Visit: Payer: Self-pay

## 2018-02-09 DIAGNOSIS — M6281 Muscle weakness (generalized): Secondary | ICD-10-CM | POA: Diagnosis present

## 2018-02-09 DIAGNOSIS — G8222 Paraplegia, incomplete: Secondary | ICD-10-CM | POA: Insufficient documentation

## 2018-02-09 DIAGNOSIS — R29818 Other symptoms and signs involving the nervous system: Secondary | ICD-10-CM | POA: Insufficient documentation

## 2018-02-09 DIAGNOSIS — R293 Abnormal posture: Secondary | ICD-10-CM | POA: Insufficient documentation

## 2018-02-09 NOTE — Therapy (Signed)
[facility] [address] [place], [place], [address] Phone: [phone]   Fax:  [phone]  Physical Therapy Evaluation  Patient Details  Name: Anthony [name] MRN: [id number] Date of Birth: 12/18/1960 Referring Provider (PT): Dr. Lucille Passy, MD   Encounter Date: 02/09/2018  PT End of Session - 02/09/18 1658    Visit Number  1    Number of Visits  17    Date for PT Re-Evaluation  04/13/18    Authorization Type  [employer]    PT Start Time  1450    PT Stop Time  1530    PT Time Calculation (min)  40 min    Activity Tolerance  Patient tolerated treatment well    Behavior During Therapy  [facility] for tasks assessed/performed       Past Medical History:  Diagnosis Date  . Broken rib 07/24/2013   s/p fall  . GERD (gastroesophageal reflux disease)   . Hypertension   . PONV (postoperative nausea and vomiting)    occasional nausea, no vomiting    Past Surgical History:  Procedure Laterality Date  . APPENDECTOMY  oct 2014  . JOINT REPLACEMENT Right jan 2009  . KNEE ARTHROSCOPY Right 07/27/2013   Procedure: RIGHT ARTHROSCOPY KNEE WITH MEDIAL MENSICUS DEBRIDEMENT;  Surgeon: Gearlean Alf, MD;  Location: [facility];  Service: Orthopedics;  Laterality: Right;  . SPINAL FUSION  May 31, 2013   C 1 to T 1 at [facility]    There were no vitals filed for this visit.   Subjective Assessment - 02/09/18 1451    Subjective  Patient reports spinal infection with surgery 09/03/17. Spent time at [facility] and spent time at Inpatient rehab. Was discharged home where he became incontenint. Had emergency spinal surgery on 09/28/17. SInce that date primary mobility in w/c. Feels like he needs to work on strength adn mobility of LE. Feels that he does not need OT. Reports no feeling of R LE. Reports a fall with car transfer with dislocation fo R hip (has a hip replacement at that hip.) Has spasticity that he is working on. Also reports a  RTC tear at R shoulder.     Patient is accompained by:  Family member   sister   Pertinent History  T8 AIS D, HTN, R hip dislocation, stage 4 pressure ulcer, spasticity, neurogenic bladdder, RTC arthropathy    Patient Stated Goals  "I want to be able to use a walker"    Currently in Pain?  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 Lucille Passy, MD    Onset Date/Surgical Date  09/03/17    Prior Therapy  Two inpatient rehab stays      Precautions   Precaution Comments  hip - currently dislocated, NWB      Restrictions   Weight Bearing Restrictions  Yes    RLE Weight Bearing  Non weight bearing    Other Position/Activity Restrictions  due to dislocated R hip      Balance Screen   Has the patient fallen in the past 6 months  Yes    How many times?  1    Has the patient had a decrease in activity level because of a fear of falling?   No    Is  the patient reluctant to leave their home because of a fear of falling?   No      Home Film/video editor residence    Living Arrangements  Spouse/significant other    Home Equipment  Wheelchair - manual;Wheelchair - power   hoyer, slide board     Prior Function   Level of Independence  Needs assistance with ADLs;Needs assistance with transfers    Vocation  On disability    Vocation Requirements  postal service      Cognition   Overall Cognitive Status  Within Functional Limits for tasks assessed      Sensation   Light Touch  Impaired by gross assessment    Additional Comments  Reports no sensation or pain to R LE, some sensation/pain to L LE      Coordination   Gross Motor Movements are Fluid and Coordinated  No    Fine Motor Movements are Fluid and Coordinated  No      Posture/Postural Control   Posture/Postural Control  Postural limitations    Postural Limitations   Rounded Shoulders;Forward head;Posterior pelvic tilt    Posture Comments  noted stage 4 pressure ulcer at sacrum      Tone   Assessment Location  Right Lower Extremity;Left Lower Extremity      ROM / Strength   AROM / PROM / Strength  PROM      PROM   Overall PROM Comments  PT able to achieve full knee extension at L knee, ~30 degrees from neutral at R knee; B ankle limited beyond neutral      Transfers   Comments  did not transfer due to painful R shoulder and dislocated R hip      Wheelchair Mobility   Wheelchair Mobility  Yes    Wheelchair Propulsion  Both upper extremities    Wheelchair Parts Management  Supervision/cueing    Distance  100 ft      RLE Tone   RLE Tone  Moderate;Severe      LLE Tone   LLE Tone  Moderate;Severe                Objective measurements completed on examination: See above findings.              PT Education - 02/09/18 1656    Education Details  education on need for NWB and hip precautions at R hip, discussed messaging Dr. Wynelle Link regarding this; discouraged stretching/strengthening at R hip to prevent further injury    Person(s) Educated  Patient    Methods  Explanation    Comprehension  Verbalized understanding       PT Short Term Goals - 02/09/18 1716      PT SHORT TERM GOAL #1   Title  Patient and wife to be independent for home stretching and strengthening program for L LE    Time  4    Period  Weeks    Status  New    Target Date  03/09/18      PT SHORT TERM GOAL #2   Title  patient to demonstrate slide board transfer to/from w/c with Mod I while maintaining NWB at R LE    Time  4    Period  Weeks    Status  New    Target Date  03/09/18      PT SHORT TERM GOAL #3   Title  patient to verbalize and demonstrate pressure relief strategies for reduced  skin breakdown    Time  4    Period  Weeks    Status  New    Target Date  03/09/18      PT SHORT TERM GOAL #4   Title  Patient to demonstrate L LE AROM to  [facility] at hip/knee/ankle needed for functional mobility    Time  4    Period  Weeks    Status  New    Target Date  03/09/18        PT Long Term Goals - 02/09/18 1657      PT LONG TERM GOAL #1   Title  Patient to be placed on hold after STGs are met or orthopedic surgeon grants clearance for further mobility             Plan - 02/09/18 1710    Clinical Impression Statement  Anthony [name] presenting to OPPT today regarding referral for progressive mobility s/p SCI at T8. Upon further examination, patient reporting fall many months ago with subsuquent R hip dislocation without ortho intervention as of yet. Patient very adament about wanting to progress mobility with primary goal of using a walker and reducing spasticity. Clinical presentation further complicated by painful R shoulder with RTC invovlement. PT providing education on need for NWB status as well as hip precautions to prevent further injury. PT reaching out to ortho MD (Dr. Wynelle Link) and requesting guidance on treatment precautions. Patient reports he performs pressure relief when sitting, however does have stage 4 pressure ulcer at sacrum. PT intervention to focus currently on patient education, home program for stretching/strengthening L LE, positioning, and transfers. Patient to benefit from skilled PT intervention to address the above listed deficits to allow for safe and independent functional mobility.     History and Personal Factors relevant to plan of care:  T8 AIS D, HTN, R hip dislocation, stage 4 pressure ulcer, spasticity, neurogenic bladdder, RTC arthropathy    Clinical Presentation  Evolving    Clinical Presentation due to:  T8 AIS D, HTN, R hip dislocation, stage 4 pressure ulcer, spasticity, neurogenic bladdder, RTC arthropathy    Clinical Decision Making  Moderate    Rehab Potential  Good    Clinical Impairments Affecting Rehab Potential  see above    PT Frequency  2x / week    PT Duration  8 weeks    PT  Treatment/Interventions  ADLs/Self Care Home Management;Cryotherapy;Electrical Stimulation;Moist Heat;Therapeutic exercise;Therapeutic activities;Functional mobility training;Gait training;DME Instruction;Balance training;Neuromuscular re-education;Patient/family education;Wheelchair mobility training;Manual techniques;Taping;Passive range of motion    PT Next Visit Plan  begin home program for stretching strengthening L LE ONLY - await response from [facility] regarding R hip, patient education    Consulted and Agree with Plan of Care  Patient       Patient will benefit from skilled therapeutic intervention in order to improve the following deficits and impairments:  Abnormal gait, Decreased balance, Decreased range of motion, Decreased mobility, Difficulty walking, Decreased strength, Impaired sensation, Decreased skin integrity, Impaired tone, Increased muscle spasms  Visit Diagnosis: Paraplegia, incomplete (HCC)  Abnormal posture  Muscle weakness (generalized)  Other symptoms and signs involving the nervous system     Problem List Patient Active Problem List   Diagnosis Date Noted  . Left lumbar radiculitis 08/12/2015  . Chronic left sacroiliac joint pain 04/22/2015  . Acute medial meniscal tear 07/27/2013    Lanney Gins, PT, DPT Supplemental Physical Therapist 02/09/18 5:22 PM Pager: [phone] Office: [facility] [address] [place], [place], [address] Phone: [phone]   Fax:  [phone]  Name: Anthony [name] MRN: [id number] Date of Birth: 02-23-1961

## 2018-02-10 NOTE — Addendum Note (Signed)
Addended by: Kipp Laurence on: 02/10/2018 07:41 AM   Modules accepted: Orders

## 2018-02-18 ENCOUNTER — Encounter: Payer: Self-pay | Admitting: Physical Therapy

## 2018-02-18 ENCOUNTER — Ambulatory Visit: Payer: Federal, State, Local not specified - PPO | Admitting: Physical Therapy

## 2018-02-18 DIAGNOSIS — R29818 Other symptoms and signs involving the nervous system: Secondary | ICD-10-CM

## 2018-02-18 DIAGNOSIS — G8222 Paraplegia, incomplete: Secondary | ICD-10-CM | POA: Diagnosis not present

## 2018-02-19 NOTE — Therapy (Addendum)
[facility] [address] [place], [place], [address] Phone: [phone]   Fax:  [phone]  Physical Therapy Treatment  Patient Details  Name: Anthony [name] MRN: [id number] Date of Birth: Mar 27, 1961 Referring Provider (PT): Dr. Lucille Passy, MD   Encounter Date: 02/18/2018  PT End of Session - 02/19/18 0836    Visit Number  2    Number of Visits  17    Date for PT Re-Evaluation  04/13/18    Authorization Type  BCBS    PT Start Time  1696    PT Stop Time  7893    PT Time Calculation (min)  42 min    Activity Tolerance  Patient tolerated treatment well;No increased pain    Behavior During Therapy  WFL for tasks assessed/performed       Past Medical History:  Diagnosis Date  . Broken rib 07/24/2013   s/p fall  . GERD (gastroesophageal reflux disease)   . Hypertension   . PONV (postoperative nausea and vomiting)    occasional nausea, no vomiting    Past Surgical History:  Procedure Laterality Date  . APPENDECTOMY  oct 2014  . JOINT REPLACEMENT Right jan 2009  . KNEE ARTHROSCOPY Right 07/27/2013   Procedure: RIGHT ARTHROSCOPY KNEE WITH MEDIAL MENSICUS DEBRIDEMENT;  Surgeon: Gearlean Alf, MD;  Location: [facility];  Service: Orthopedics;  Laterality: Right;  . SPINAL FUSION  May 31, 2013   C 1 to T 1 at [facility]    There were no vitals filed for this visit.  Subjective Assessment - 02/18/18 1404    Subjective  Saw Dr. Onnie Graham re: right RTC tear and is not a candidate for surgery. Has not seen Dr. Maureen Ralphs since hip dislocation happened. Probably dislocated in September when fell doing car transfer. Has been hard to get in to see Dr. Maureen Ralphs (he does not want to see his PA). States he has told the office his hip is dislocated, but thinks because he is not in pain, he has had to wait to be seen.     Patient is accompained by:  Family member   sister   Pertinent History  09/03/17 back surgery for LLE  weakness and pain; emergent surgery 09/28/17 due to spinal infection with resulting T8 paraplegia, pt underwent [facility] inpt rehab, then HHPT and not managing well at home with return to inpt rehab at [facility], on admiision incidentally found to have R hip dislocation on KUB, [facility] ortho consulted and did not attempt relocation due to spasticity and deferred to his primary orthopedist who performed prior Rt THA, stage 4 pressure ulcer, spasticity, neurogenic bladdder, rt RTC tear     Patient Stated Goals  "I want to be able to use a walker"    Currently in Pain?  No/denies        Treatment- Reviewed patient's current HEP (as he can recall)--see list under Plan, home exercise program. Pt reports he cannot do most of the exercises without assistance as he cannot move his legs off his foot rests by himself to free his legs to move. He demonstrates seated in wheelchair with feet on foot rests: hip abduction, ankle pumps. After assisted to remove feet from foot rests: LAQ bil.   Transfer-to right with sliding board. Noted w/c handle protrudes and makes sliding board placement difficult. Pt then notes this is not his wheelchair (it is the clinic wheelchair) as his wife could not transport his w/c as it takes too  long to disassemble back and load into car. Sliding board to his right with 1 person stabilizing w/c on tile and 2nd person placing board, blocking his knees at pt's instruction, and min assist to scoot across board. On return to w/c to pt's left, same amount of assist/set-up.   Supine-RLE remains in flexed position throughout. LLE draws into flexion due to spasiticity. With slow prolonged stretch, able to achieve hip extension to point of ~20 degrees flexion, knee ~20 degrees of flexion, and ankle ~15 degrees of plantarflexion (attempting to dorsiflex with leg extended). Requested pt turn on his left side to assess tone in sidelying, he reports he lies on his right side at home (effected rt hip) and  prefers his right. Required min assist to turn on right side. Legs draw into flexion. With left knee flexed, can extend hip to ~10 degrees of flexion. With left hip flexed, can extend knee to ~20 degrees of flexion. Pt unable to activate hip extension in sidelying. Prior to transfer to mat, had discussed possible use of skate under LLE for AROM, however once assessed in supine and sidelying, noted his spasticity is too great for this intervention. Unable to make any further recommendations for improving his ability to independently stretch his LEs at this time due to rt hip barriers, rt shoulder limitations, and spasticity.  Educated on ?appropriateness of botox vs baclofen pump. Will need MD input on further ways to control spasticity.                       PT Education - 02/19/18 [phone]    Education Details  reviewed his current HEP and again recommended not to stretch or exercise rt hip until he sees Dr. Maureen Ralphs; may need to put him on hold until he is seen    Person(s) Educated  Patient    Methods  Explanation    Comprehension  Verbalized understanding       PT Short Term Goals - 02/09/18 1716      PT SHORT TERM GOAL #1   Title  Patient and wife to be independent for home stretching and strengthening program for L LE    Time  4    Period  Weeks    Status  New    Target Date  03/09/18      PT SHORT TERM GOAL #2   Title  patient to demonstrate slide board transfer to/from w/c with Mod I while maintaining NWB at R LE    Time  4    Period  Weeks    Status  New    Target Date  03/09/18      PT SHORT TERM GOAL #3   Title  patient to verbalize and demonstrate pressure relief strategies for reduced skin breakdown    Time  4    Period  Weeks    Status  New    Target Date  03/09/18      PT SHORT TERM GOAL #4   Title  Patient to demonstrate L LE AROM to [facility] at hip/knee/ankle needed for functional mobility    Time  4    Period  Weeks    Status  New    Target Date   03/09/18        PT Long Term Goals - 02/09/18 1657      PT LONG TERM GOAL #1   Title  Patient to be placed on hold after STGs are met or orthopedic surgeon  grants clearance for further mobility            Plan - 02/18/18 1700    Clinical Impression Statement  Session focused on review of pt's current HEP and attempts to modify program to improve his independence with program as he reports his wife is very busy with work Youth worker) and often does not have time to assist him with stretches/exercises. Difficult situation currently due to rt hip dislocation and lack of guidance by Dr. Maureen Ralphs (who pt reports has not yet seen pt since dislocation in September, however he has an appt to see him 03/07/18). All of pt's mobility and positioning is significantly impacted by his LE spasticity. Discussed current barriers to PT and will discuss plan of care with additonal members of PT treatment team to determine if appropriate to proceed with PT or need to place pt on hold.     Rehab Potential  Good    Clinical Impairments Affecting Rehab Potential  see above    PT Frequency  2x / week    PT Duration  8 weeks    PT Treatment/Interventions  ADLs/Self Care Home Management;Cryotherapy;Electrical Stimulation;Moist Heat;Therapeutic exercise;Therapeutic activities;Functional mobility training;Gait training;DME Instruction;Balance training;Neuromuscular re-education;Patient/family education;Wheelchair mobility training;Manual techniques;Taping;Passive range of motion    PT Next Visit Plan  ?focus on rt shoulder rehab/education; ? how much is he using manual chair vs power chair (per chart was ordered at [facility] thru [employer] to be delivered once home) did he get Roho cushion that was ordered?? (Care has been provided both at [facility] and hard to follow what's been addressed where); ?suggestions for spasticity management (we briefly discussed botox vs baclofen pump--not sure he is a surgical  candidate at this point)    PT Home Exercise Plan  As of 11/15, pt reports doing: seated-LAQ, hip abduction, ankle pumps; supine-LLE only--heelslides, hip abduction (sometimes foot on sliding board for decr resistance).    Consulted and Agree with Plan of Care  Patient       Patient will benefit from skilled therapeutic intervention in order to improve the following deficits and impairments:  Abnormal gait, Decreased balance, Decreased range of motion, Decreased mobility, Difficulty walking, Decreased strength, Impaired sensation, Decreased skin integrity, Impaired tone, Increased muscle spasms  Visit Diagnosis: Paraplegia, incomplete ([place])  Other symptoms and signs involving the nervous system     Problem List Patient Active Problem List   Diagnosis Date Noted  . Left lumbar radiculitis 08/12/2015  . Chronic left sacroiliac joint pain 04/22/2015  . Acute medial meniscal tear 07/27/2013    Rexanne Mano, PT 02/19/2018, 9:27 AM  [facility] [address] [place], [place], [address] Phone: [phone]   Fax:  [phone]  Name: Anthony [name] MRN: [id number] Date of Birth: July 16, 1960  PHYSICAL THERAPY DISCHARGE SUMMARY  Visits from Start of Care: 2  Current functional level related to goals / functional outcomes: See above; essentially w/c bound due to SCI - difficulty with transfers due to shoulder pathology; unable to assess functional status as patient did not return following last visit; spasticity limiting   Remaining deficits: See above; only presented to 1 follow-up visit; PT greatly restricted due to spasticity and R hip dislocation - PT educating patient that we would not perform weight bearing or ROM activities at this hip due to risk of further injury. Did follow up with ortho MD - not a surgical candidate currently.   Education / Equipment: HEP  Plan: Patient  agrees to discharge.  Patient goals were not  met. Patient is being discharged due to not returning since the last visit.  ?????     Lanney Gins, PT, DPT Supplemental Physical Therapist 05/18/18 1:48 PM Pager: [phone] Office: [phone]

## 2018-02-23 ENCOUNTER — Ambulatory Visit: Payer: Federal, State, Local not specified - PPO | Admitting: Physical Therapy

## 2018-02-25 ENCOUNTER — Ambulatory Visit: Payer: Federal, State, Local not specified - PPO | Admitting: Rehabilitation

## 2018-03-02 ENCOUNTER — Ambulatory Visit: Payer: Federal, State, Local not specified - PPO | Admitting: Physical Therapy

## 2018-03-07 ENCOUNTER — Ambulatory Visit: Payer: Federal, State, Local not specified - PPO | Admitting: Physical Therapy

## 2018-03-09 ENCOUNTER — Ambulatory Visit: Payer: Federal, State, Local not specified - PPO | Admitting: Physical Therapy

## 2018-03-14 ENCOUNTER — Ambulatory Visit: Payer: Federal, State, Local not specified - PPO | Admitting: Physical Therapy

## 2018-03-17 ENCOUNTER — Ambulatory Visit: Payer: Federal, State, Local not specified - PPO | Admitting: Physical Therapy

## 2018-03-21 ENCOUNTER — Encounter: Payer: Self-pay | Admitting: Physical Therapy

## 2018-03-21 ENCOUNTER — Ambulatory Visit: Payer: Federal, State, Local not specified - PPO | Admitting: Physical Therapy

## 2018-03-23 ENCOUNTER — Encounter: Payer: Self-pay | Admitting: Physical Therapy

## 2018-03-24 ENCOUNTER — Ambulatory Visit: Payer: Federal, State, Local not specified - PPO | Admitting: Physical Therapy

## 2018-03-28 ENCOUNTER — Ambulatory Visit: Payer: Federal, State, Local not specified - PPO | Admitting: Physical Therapy

## 2018-03-29 ENCOUNTER — Ambulatory Visit: Payer: Federal, State, Local not specified - PPO | Admitting: Physical Therapy

## 2018-04-04 ENCOUNTER — Ambulatory Visit: Payer: Federal, State, Local not specified - PPO | Admitting: Physical Therapy

## 2018-04-05 ENCOUNTER — Ambulatory Visit: Payer: Federal, State, Local not specified - PPO | Admitting: Physical Therapy

## 2018-04-11 ENCOUNTER — Ambulatory Visit: Payer: Federal, State, Local not specified - PPO | Admitting: Physical Therapy

## 2018-04-14 ENCOUNTER — Ambulatory Visit: Payer: Federal, State, Local not specified - PPO | Admitting: Physical Therapy

## 2018-12-28 ENCOUNTER — Other Ambulatory Visit: Payer: Self-pay | Admitting: Family Medicine

## 2019-01-04 ENCOUNTER — Other Ambulatory Visit: Payer: Self-pay | Admitting: Family Medicine

## 2019-01-08 ENCOUNTER — Other Ambulatory Visit: Payer: Self-pay | Admitting: Family Medicine

## 2019-01-10 ENCOUNTER — Other Ambulatory Visit: Payer: Self-pay | Admitting: Family Medicine

## 2019-01-10 NOTE — Telephone Encounter (Signed)
Please call  Pharmacy and tell them to stop sending refill requests for this patient. He has never been a patient of this practice. Looks like his PCP is [facility]

## 2019-01-10 NOTE — Telephone Encounter (Signed)
Pharmacy notified.

## 2019-02-13 ENCOUNTER — Other Ambulatory Visit: Payer: Self-pay | Admitting: Family Medicine

## 2019-02-13 NOTE — Telephone Encounter (Signed)
[facility] notified. Pharmacist states he will take care of it.

## 2019-02-13 NOTE — Telephone Encounter (Signed)
[facility] and tell them this is not my patient and to stop send refill requests. This patient has never been seen here.

## 2021-10-31 ENCOUNTER — Institutional Professional Consult (permissible substitution)
Admission: AD | Admit: 2021-10-31 | Discharge: 2021-11-03 | Disposition: A | Discharge status: Other Acute Inpatient Hospital | Payer: Self-pay | Source: Other Acute Inpatient Hospital

## 2021-11-01 ENCOUNTER — Other Ambulatory Visit (HOSPITAL_COMMUNITY): Payer: Self-pay

## 2021-11-01 LAB — CBC WITH DIFFERENTIAL/PLATELET
Abs Immature Granulocytes: 0.04 10*3/uL (ref 0.00–0.07)
Basophils Absolute: 0.1 10*3/uL (ref 0.0–0.1)
Basophils Relative: 1 %
Eosinophils Absolute: 0.5 10*3/uL (ref 0.0–0.5)
Eosinophils Relative: 7 %
HCT: 25.9 % — ABNORMAL LOW (ref 39.0–52.0)
Hemoglobin: 8.4 g/dL — ABNORMAL LOW (ref 13.0–17.0)
Immature Granulocytes: 1 %
Lymphocytes Relative: 21 %
Lymphs Abs: 1.5 10*3/uL (ref 0.7–4.0)
MCH: 27.9 pg (ref 26.0–34.0)
MCHC: 32.4 g/dL (ref 30.0–36.0)
MCV: 86 fL (ref 80.0–100.0)
Monocytes Absolute: 0.8 10*3/uL (ref 0.1–1.0)
Monocytes Relative: 11 %
Neutro Abs: 4.2 10*3/uL (ref 1.7–7.7)
Neutrophils Relative %: 59 %
Platelets: 418 10*3/uL — ABNORMAL HIGH (ref 150–400)
RBC: 3.01 MIL/uL — ABNORMAL LOW (ref 4.22–5.81)
RDW: 15.9 % — ABNORMAL HIGH (ref 11.5–15.5)
WBC: 7.1 10*3/uL (ref 4.0–10.5)
nRBC: 0 % (ref 0.0–0.2)

## 2021-11-01 LAB — COMPREHENSIVE METABOLIC PANEL
ALT: 18 U/L (ref 0–44)
AST: 17 U/L (ref 15–41)
Albumin: 2 g/dL — ABNORMAL LOW (ref 3.5–5.0)
Alkaline Phosphatase: 88 U/L (ref 38–126)
Anion gap: 6 (ref 5–15)
BUN: 18 mg/dL (ref 8–23)
CO2: 28 mmol/L (ref 22–32)
Calcium: 8.2 mg/dL — ABNORMAL LOW (ref 8.9–10.3)
Chloride: 99 mmol/L (ref 98–111)
Creatinine, Ser: 0.48 mg/dL — ABNORMAL LOW (ref 0.61–1.24)
GFR, Estimated: >60 mL/min (ref >60)
Glucose, Bld: 91 mg/dL (ref 70–99)
Potassium: 3.8 mmol/L (ref 3.5–5.1)
Sodium: 133 mmol/L — ABNORMAL LOW (ref 135–145)
Total Bilirubin: 0.3 mg/dL (ref 0.3–1.2)
Total Protein: 5.5 g/dL — ABNORMAL LOW (ref 6.5–8.1)

## 2021-11-01 LAB — PROTIME-INR
INR: 1.3 — ABNORMAL HIGH (ref 0.8–1.2)
Prothrombin Time: 16.4 seconds — ABNORMAL HIGH (ref 11.4–15.2)

## 2021-11-01 LAB — PHOSPHORUS: Phosphorus: 3.9 mg/dL (ref 2.5–4.6)

## 2021-11-01 LAB — HEMOGLOBIN A1C
Hgb A1c MFr Bld: 5.4 % (ref 4.8–5.6)
Mean Plasma Glucose: 108.28 mg/dL

## 2021-11-01 LAB — MAGNESIUM: Magnesium: 2 mg/dL (ref 1.7–2.4)

## 2021-11-03 ENCOUNTER — Emergency Department (HOSPITAL_COMMUNITY)
Admission: EM | Admit: 2021-11-03 | Discharge: 2021-11-03 | Disposition: A | Discharge status: Other Acute Inpatient Hospital | Payer: Federal, State, Local not specified - PPO | Attending: Emergency Medicine | Admitting: Emergency Medicine

## 2021-11-03 ENCOUNTER — Encounter (HOSPITAL_COMMUNITY): Payer: Self-pay | Admitting: Physical Medicine & Rehabilitation

## 2021-11-03 ENCOUNTER — Institutional Professional Consult (permissible substitution)
Admission: RE | Admit: 2021-11-03 | Discharge: 2021-11-12 | Disposition: A | Discharge status: Home / Self Care | Payer: Federal, State, Local not specified - PPO

## 2021-11-03 DIAGNOSIS — T85610A Breakdown (mechanical) of epidural and subdural infusion catheter, initial encounter: Secondary | ICD-10-CM | POA: Insufficient documentation

## 2021-11-03 DIAGNOSIS — Z79899 Other long term (current) drug therapy: Secondary | ICD-10-CM | POA: Insufficient documentation

## 2021-11-03 NOTE — Procedures (Addendum)
Baclofen Pump Refill. (Synchromed II)      After informed consent and interrogation of the patient's baclofen pump to confirm residual volume, rate, concentration, etc, his abdomen was prepped with betadine. Baclofen kit was then opened at beside. Sterile technique was utilized to dawn gloves and drape the area. Using the plastic guide, I localized the fill port and inserted a needle with clamped line into the pump. Next, the remaining baclofen was removed and the volume was compatible with interrogation device ( 5 ml). Thereafter, baclofen was drawn into the syringe from the supplied vial. A filter was applied to syringe and excess air was expelled through the filter.. I then attached the syringe/filter to the clamped line .  Then the line was unclamped and with slow, gradual pressure I replaced  40cc of intrathecal baclofen (2 ampule of 40 mg/20 mL (2,000 mcg/mL)) into the pump. The line was re-clamped and I removed the needle from the abdomen.  The area was then cleaned and dressing was applied. .  After completing the fill, the pump was reinterrogated to confirm concentration/volume/rate. We decided to continue his rate  459.0 mcg/day. His alarm date is now 04/17/2022.  Pt to follow up with his doctor at [facility] prior to his next refill.

## 2021-11-03 NOTE — ED Provider Notes (Signed)
[facility] EMERGENCY DEPARTMENT Provider Note   CSN: [id number] Arrival date & time: 11/03/21  1059     History  No chief complaint on file.   Anthony [name] is a 61 y.o. male patient presents to the emergency department to have a baclofen pump refill.  Patient denies any other complaints.    HPI     Home Medications Prior to Admission medications   Medication Sig Start Date End Date Taking? Authorizing Provider  atorvastatin (LIPITOR) 40 MG tablet Take 40 mg by mouth daily.    [provider]  B Complex-C (B-COMPLEX WITH VITAMIN C) tablet Take 1 tablet by mouth daily.    [provider]  DULoxetine (CYMBALTA) 60 MG capsule Take 60 mg by mouth every evening.    [provider]  ezetimibe (ZETIA) 10 MG tablet Take 10 mg by mouth every evening.    [provider]  gabapentin (NEURONTIN) 600 MG tablet Take 1 tablet (600 mg total) by mouth 3 (three) times daily. 05/20/15   Kirsteins, Victorino Sparrow, MD  lisinopril (PRINIVIL,ZESTRIL) 40 MG tablet Take 40 mg by mouth every morning.    [provider]  meloxicam (MOBIC) 15 MG tablet Take 15 mg by mouth daily.    [provider]  Multiple Vitamin (MULTIVITAMIN WITH MINERALS) TABS tablet Take 1 tablet by mouth daily.    [provider]  omeprazole (PRILOSEC) 40 MG capsule Take 20 mg by mouth daily.     [provider]  traMADol (ULTRAM) 50 MG tablet TAKE 1 TABLET BY MOUTH EVERY 12 HOURS AS NEEDED 01/03/16   Kirsteins, Victorino Sparrow, MD      Allergies    Penicillins and Brassica oleracea    Review of Systems   Review of Systems  Physical Exam Updated Vital Signs There were no vitals taken for this visit. Physical Exam Vitals and nursing note reviewed.  Constitutional:      General: He is not in acute distress.    Appearance: He is not ill-appearing, toxic-appearing or diaphoretic.     Comments: Patient sitting in wheelchair no acute distress or  discomfort.  HENT:     Head: Normocephalic.  Eyes:     General: No scleral icterus.       Right eye: No discharge.        Left eye: No discharge.  Cardiovascular:     Rate and Rhythm: Normal rate.  Pulmonary:     Effort: Pulmonary effort is normal.  Skin:    General: Skin is warm and dry.  Neurological:     Mental Status: He is alert.  Psychiatric:        Behavior: Behavior is cooperative.     ED Results / Procedures / Treatments   Labs (all labs ordered are listed, but only abnormal results are displayed) Labs Reviewed - No data to display  EKG None  Radiology No results found.  Procedures Procedures    Medications Ordered in ED Medications - No data to display  ED Course/ Medical Decision Making/ A&P                           Medical Decision Making  Alert 61 year old male in no acute stress, nontoxic-appearing.  Presents to the ED for baclofen pump refill.  Patient had no other complaints at this time.  Baclofen pump was refilled by Dr.Shtridelman.  Patient was discharge and is to follow-up with his Dr. Garrison Columbus  prior to his next refill.        Final Clinical Impression(s) / ED Diagnoses Final diagnoses:  None    Rx / DC Orders ED Discharge Orders     None         Haskel Schroeder, PA-C 11/03/21 1244    Terald Sleeper, MD 11/03/21 [phone]

## 2021-11-05 LAB — CBC
HCT: 27 % — ABNORMAL LOW (ref 39.0–52.0)
Hemoglobin: 8.8 g/dL — ABNORMAL LOW (ref 13.0–17.0)
MCH: 27.8 pg (ref 26.0–34.0)
MCHC: 32.6 g/dL (ref 30.0–36.0)
MCV: 85.2 fL (ref 80.0–100.0)
Platelets: 399 10*3/uL (ref 150–400)
RBC: 3.17 MIL/uL — ABNORMAL LOW (ref 4.22–5.81)
RDW: 16.1 % — ABNORMAL HIGH (ref 11.5–15.5)
WBC: 6.9 10*3/uL (ref 4.0–10.5)
nRBC: 0 % (ref 0.0–0.2)

## 2021-11-05 LAB — BASIC METABOLIC PANEL
Anion gap: 6 (ref 5–15)
BUN: 14 mg/dL (ref 8–23)
CO2: 25 mmol/L (ref 22–32)
Calcium: 8.2 mg/dL — ABNORMAL LOW (ref 8.9–10.3)
Chloride: 102 mmol/L (ref 98–111)
Creatinine, Ser: 0.41 mg/dL — ABNORMAL LOW (ref 0.61–1.24)
GFR, Estimated: >60 mL/min (ref >60)
Glucose, Bld: 91 mg/dL (ref 70–99)
Potassium: 3.8 mmol/L (ref 3.5–5.1)
Sodium: 133 mmol/L — ABNORMAL LOW (ref 135–145)

## 2021-11-05 LAB — MAGNESIUM: Magnesium: 2 mg/dL (ref 1.7–2.4)

## 2021-11-08 LAB — BASIC METABOLIC PANEL
Anion gap: 6 (ref 5–15)
Anion gap: 7 (ref 5–15)
BUN: 11 mg/dL (ref 8–23)
BUN: 14 mg/dL (ref 8–23)
CO2: 25 mmol/L (ref 22–32)
CO2: 25 mmol/L (ref 22–32)
Calcium: 8.2 mg/dL — ABNORMAL LOW (ref 8.9–10.3)
Calcium: 8.1 mg/dL — ABNORMAL LOW (ref 8.9–10.3)
Chloride: 102 mmol/L (ref 98–111)
Chloride: 103 mmol/L (ref 98–111)
Creatinine, Ser: 0.45 mg/dL — ABNORMAL LOW (ref 0.61–1.24)
Creatinine, Ser: 0.48 mg/dL — ABNORMAL LOW (ref 0.61–1.24)
GFR, Estimated: >60 mL/min (ref >60)
GFR, Estimated: >60 mL/min (ref >60)
Glucose, Bld: 108 mg/dL — ABNORMAL HIGH (ref 70–99)
Glucose, Bld: 105 mg/dL — ABNORMAL HIGH (ref 70–99)
Potassium: 4 mmol/L (ref 3.5–5.1)
Potassium: 4.2 mmol/L (ref 3.5–5.1)
Sodium: 133 mmol/L — ABNORMAL LOW (ref 135–145)
Sodium: 135 mmol/L (ref 135–145)

## 2021-11-08 LAB — CBC
HCT: 28.7 % — ABNORMAL LOW (ref 39.0–52.0)
Hemoglobin: 9.2 g/dL — ABNORMAL LOW (ref 13.0–17.0)
MCH: 27.5 pg (ref 26.0–34.0)
MCHC: 32.1 g/dL (ref 30.0–36.0)
MCV: 85.7 fL (ref 80.0–100.0)
Platelets: 416 10*3/uL — ABNORMAL HIGH (ref 150–400)
RBC: 3.35 MIL/uL — ABNORMAL LOW (ref 4.22–5.81)
RDW: 16 % — ABNORMAL HIGH (ref 11.5–15.5)
WBC: 6.7 10*3/uL (ref 4.0–10.5)
nRBC: 0 % (ref 0.0–0.2)

## 2021-11-08 LAB — CK: Total CK: 31 U/L — ABNORMAL LOW (ref 49–397)

## 2021-11-08 NOTE — Consult Note (Signed)
Infectious Disease Consultation   [name]  LKG:[id number]  DOB: November 27, 1960  DOA: 11/03/2021  Requesting physician: Dr. Manson Passey  Reason for consultation: Antibiotic Recommendations  History of Present Illness: Anthony [name] is an 61 y.o. male with medically significant for hypertension, hyperlipidemia, paraplegia secondary to epidural abscess status post decompression and laminectomy on 10/02/2017 with retained hardware.  He also has neurogenic bladder and bowel.  He has history of recurrent UTIs for which he does self-catheterization.  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  Patient initially had skin breakdown but progressed to ulcer over 2 weeks prior to presentation to the acute facility associated with fevers.  He had fecal incontinence.  Stool for C. difficile and GI pathogen panel was negative.  He had rectal tube placed in the concerns for continuous fecal contamination of the pressure ulcer that would impair healing.  General surgery was consulted and he is status post diverting ostomy done on 10/24/2021.  He had decreased output from the ostomy.  He had repeat imaging done on 7/27 consistent with colonic pseudoobstruction which subsequently resolved.  Blood cultures did not show any growth.  He was initially started on broad-spectrum antibiotics.  During his colostomy placement he also underwent debridement of his sacral pressure ulcer.  His ischial bone was soft and spongy consistent with osteomyelitis.  Cultures did not show any growth likely because he was on antibiotics.  Infectious disease was consulted and they recommended a treatment with IV daptomycin, ciprofloxacin, Flagyl with tentative end date 12/05/2021.  He had a PICC line placed.  Due to his complex medical problems he was transferred and admitted to [facility]. Review of Systems:  As per HPI otherwise 10 point review of systems negative.   Past Medical History: Past Medical History:  Diagnosis Date   Broken rib 07/24/2013   s/p fall   GERD (gastroesophageal reflux disease)    Hypertension    PONV (postoperative nausea and vomiting)    occasional nausea, no vomiting   Acute deep vein thrombosis (DVT) of right lower extremity (HCC) 01/2018   Depression   GERD (gastroesophageal reflux disease)   HLD (hyperlipidemia)   Hypercholesterolemia   Hypertension   Hypertension with goal to be determined   Insomnia   Iron deficiency   Neck pain   Neurogenic bladder 10/12/2017   Paraplegia (HCC) 10/08/2017   Polycythemia vera(238.4)   PONV (postoperative nausea and vomiting)   Post herpetic neuralgia   s/p ACDF 08/18/2012   Stomach acid   Urinary incontinence 02/25/2021   Vitamin D insufficiency   Past Surgical History: Past Surgical History:  Procedure Laterality Date   APPENDECTOMY  oct 2014   JOINT REPLACEMENT Right jan 2009   KNEE ARTHROSCOPY Right 07/27/2013   Procedure: RIGHT ARTHROSCOPY KNEE WITH MEDIAL MENSICUS DEBRIDEMENT;  Surgeon: Loanne Drilling, MD;  Location: [facility];  Service: Orthopedics;  Laterality: Right;   SPINAL FUSION  May 31, 2013   C 1 to T 1 at [facility]   Allergies:   Allergies  Allergen Reactions   Penicillins Anaphylaxis   Brassica Oleracea Nausea And Vomiting    broccoli     Social History:  reports that he has never smoked. He has never used smokeless tobacco. He reports that he does not drink alcohol and does  not use drugs.   Family History:  Hypertension Mother   Heart disease Mother   Diabetes Mother   Coronary artery disease Mother   Hyperlipidemia Mother   Osteoporosis Mother   Hypertension Father   Heart disease Father   Hyperlipidemia Father   Thyroid disease Sister   Hyperlipidemia Sister   Hypertension Maternal Aunt   Hyperlipidemia Maternal Aunt   Hypertension  Maternal Uncle   Hyperlipidemia Maternal Uncle   Hypertension Paternal Aunt   Hyperlipidemia Paternal Aunt   Hypertension Paternal Uncle   Hyperlipidemia Paternal Uncle   Diabetes Maternal Grandmother   Pancreatic cancer Paternal Grandfather   Physical Exam: Vitals: Temperature 97, pulse 80, respiratory rate 18, blood pressure 122/75, pulse oximetry 100%  Constitutional: Awake, not in any acute distress Head: Atraumatic, normocephalic Eyes: PERLA, EOMI  ENMT: external ears and nose appear normal, normal hearing, lips normal, moist oral mucosa Neck: Supple CVS: S1-S2 clear, no murmur  Respiratory: Decreased breath sounds lower lobes, no rhonchi or wheezing.  Abdomen: soft nontender, positive bowel sounds, ostomy Musculoskeletal: Bilateral AKA                     Neuro: Bilateral AKA, debility with generalized weakness Psych: Stable Skin: no rashes, stage IV sacral pressure ulcer  Data reviewed:  I have personally reviewed following labs and imaging studies Labs:  CBC: Recent Labs  Lab 11/05/21 0313 11/08/21 0403  WBC 6.9 6.7  HGB 8.8* 9.2*  HCT 27.0* 28.7*  MCV 85.2 85.7  PLT 399 416*    Basic Metabolic Panel: Recent Labs  Lab 11/05/21 0313 11/08/21 0403 11/08/21 1622  NA 133* 133* 135  K 3.8 4.0 4.2  CL 102 102 103  CO2 25 25 25   GLUCOSE 91 108* 105*  BUN 14 11 14   CREATININE 0.41* 0.45* 0.48*  CALCIUM 8.2* 8.2* 8.1*  MG 2.0  --   --    GFR CrCl cannot be calculated (Unknown ideal weight.). Liver Function Tests: No results for input(s): "AST", "ALT", "ALKPHOS", "BILITOT", "PROT", "ALBUMIN" in the last 168 hours. No results for input(s): "LIPASE", "AMYLASE" in the last 168 hours. No results for input(s): "AMMONIA" in the last 168 hours. Coagulation profile No results for input(s): "INR", "PROTIME" in the last 168 hours.  Cardiac Enzymes: Recent Labs  Lab 11/08/21 0403  CKTOTAL 31*   BNP: Invalid input(s): "POCBNP" CBG: No results for input(s):  "GLUCAP" in the last 168 hours. D-Dimer No results for input(s): "DDIMER" in the last 72 hours. Hgb A1c No results for input(s): "HGBA1C" in the last 72 hours. Lipid Profile No results for input(s): "CHOL", "HDL", "LDLCALC", "TRIG", "CHOLHDL", "LDLDIRECT" in the last 72 hours. Thyroid function studies No results for input(s): "TSH", "T4TOTAL", "T3FREE", "THYROIDAB" in the last 72 hours.  Invalid input(s): "FREET3" Anemia work up No results for input(s): "VITAMINB12", "FOLATE", "FERRITIN", "TIBC", "IRON", "RETICCTPCT" in the last 72 hours. Urinalysis    Component Value Date/Time   COLORURINE YELLOW 04/27/2007 0945   APPEARANCEUR CLEAR 04/27/2007 0945   LABSPEC 1.006 04/27/2007 0945   PHURINE 6.0 04/27/2007 0945   GLUCOSEU NEGATIVE 04/27/2007 0945   HGBUR NEGATIVE 04/27/2007 0945   BILIRUBINUR NEGATIVE 04/27/2007 0945   KETONESUR NEGATIVE 04/27/2007 0945   PROTEINUR NEGATIVE 04/27/2007 0945   UROBILINOGEN 0.2 04/27/2007 0945   NITRITE NEGATIVE 04/27/2007 0945   LEUKOCYTESUR  04/27/2007 0945    NEGATIVE MICROSCOPIC NOT DONE ON URINES WITH NEGATIVE PROTEIN, BLOOD, LEUKOCYTES, NITRITE, OR GLUCOSE <1000 mg/dL.  Sepsis Labs Recent Labs  Lab 11/05/21 0313 11/08/21 0403  WBC 6.9 6.7   Microbiology No results found for this or any previous visit (from the past 240 hour(s)).   Inpatient Medications:   Please see MAR   Radiological Exams on Admission: No results found.  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   Intraoperative biopsy results that showed osteomyelitis.  However, cultures were negative probably because he was already on broad antimicrobial coverage prior to debridement.  Recommend to continue treatment with IV daptomycin, ciprofloxacin, Flagyl which can be oral.  Tentative end date for antibiotics is 12/05/2021.  Please monitor CBC, CMP, weekly CK while on the antibiotics.  Continue local wound care.  Osteomyelitis of the lower extremity status post AKA: Wounds look stable.  Continue local wound care.  Neurogenic bladder/muscle spasticity: Continue supportive management, medications per the primary team.  Anemia/hyperlipidemia: Management per the primary team.  Protein calorie malnutrition: Dietitian consulted.  Management per the primary team.  Due to his complex medical problems he is high risk for worsening or decompensation.  Plan of care discussed with the primary team and pharmacy.  Thank you for this consultation.    Vonzella Nipple M.D. 11/08/2021, 10:04 PM

## 2021-11-09 LAB — CBC
HCT: 30.2 % — ABNORMAL LOW (ref 39.0–52.0)
Hemoglobin: 9.5 g/dL — ABNORMAL LOW (ref 13.0–17.0)
MCH: 27.1 pg (ref 26.0–34.0)
MCHC: 31.5 g/dL (ref 30.0–36.0)
MCV: 86 fL (ref 80.0–100.0)
Platelets: 452 10*3/uL — ABNORMAL HIGH (ref 150–400)
RBC: 3.51 MIL/uL — ABNORMAL LOW (ref 4.22–5.81)
RDW: 16.1 % — ABNORMAL HIGH (ref 11.5–15.5)
WBC: 6.1 10*3/uL (ref 4.0–10.5)
nRBC: 0 % (ref 0.0–0.2)

## 2021-11-09 LAB — BASIC METABOLIC PANEL
Anion gap: 9 (ref 5–15)
BUN: 12 mg/dL (ref 8–23)
CO2: 25 mmol/L (ref 22–32)
Calcium: 8.5 mg/dL — ABNORMAL LOW (ref 8.9–10.3)
Chloride: 99 mmol/L (ref 98–111)
Creatinine, Ser: 0.43 mg/dL — ABNORMAL LOW (ref 0.61–1.24)
GFR, Estimated: >60 mL/min (ref >60)
Glucose, Bld: 102 mg/dL — ABNORMAL HIGH (ref 70–99)
Potassium: 4.4 mmol/L (ref 3.5–5.1)
Sodium: 133 mmol/L — ABNORMAL LOW (ref 135–145)

## 2021-11-09 LAB — MAGNESIUM: Magnesium: 2.1 mg/dL (ref 1.7–2.4)

## 2021-11-10 LAB — CK: Total CK: 34 U/L — ABNORMAL LOW (ref 49–397)

## 2021-11-11 LAB — BASIC METABOLIC PANEL
Anion gap: 9 (ref 5–15)
BUN: 15 mg/dL (ref 8–23)
CO2: 25 mmol/L (ref 22–32)
Calcium: 8.3 mg/dL — ABNORMAL LOW (ref 8.9–10.3)
Chloride: 102 mmol/L (ref 98–111)
Creatinine, Ser: 0.42 mg/dL — ABNORMAL LOW (ref 0.61–1.24)
GFR, Estimated: >60 mL/min (ref >60)
Glucose, Bld: 109 mg/dL — ABNORMAL HIGH (ref 70–99)
Potassium: 4 mmol/L (ref 3.5–5.1)
Sodium: 136 mmol/L (ref 135–145)

## 2021-11-11 NOTE — Progress Notes (Signed)
PROGRESS NOTE    Anthony [name]  MVH:[id number] DOB: November 30, 1960 DOA: 11/03/2021  Brief Narrative:  Anthony [name] is an 61 y.o. male with medically significant for hypertension, hyperlipidemia, paraplegia secondary to epidural abscess status post decompression and laminectomy on 10/02/2017 with retained hardware.  He also has neurogenic bladder and bowel.  He has history of recurrent UTIs for which he does self-catheterization.  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  Patient initially had skin breakdown but progressed to ulcer over 2 weeks prior to presentation to the acute facility associated with fevers.  He had fecal incontinence.  Stool for C. difficile and GI pathogen panel was negative.  He had rectal tube placed in the concerns for continuous fecal contamination of the pressure ulcer that would impair healing.  General surgery was consulted and he is status post diverting ostomy done on 10/24/2021.  He had decreased output from the ostomy.  He had repeat imaging done on 7/27 consistent with colonic pseudoobstruction which subsequently resolved.  Blood cultures did not show any growth.  He was initially started on broad-spectrum antibiotics.  During his colostomy placement he also underwent debridement of his sacral pressure ulcer.  His ischial bone was soft and spongy consistent with osteomyelitis.  Cultures did not show any growth likely because he was on antibiotics.  Infectious disease was consulted and they recommended a treatment with IV daptomycin, ciprofloxacin, Flagyl with tentative end date 12/05/2021.  He had a PICC line placed.  Due to his complex medical problems he was transferred and admitted to [facility]. He has been tolerating the antibiotics.  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  Intraoperative biopsy results that showed osteomyelitis.  However, cultures were negative probably because he was already on broad antimicrobial coverage prior to debridement.  Recommend to continue treatment with IV daptomycin, ciprofloxacin, Flagyl which can be oral.  Tentative end date for antibiotics is 12/05/2021.  He has been tolerating the antibiotics. Please monitor CBC, CMP, weekly CK while on the antibiotics.  Continue local wound care.   Osteomyelitis of the lower extremity status post AKA: Wounds look stable.  Continue local wound care.   Neurogenic bladder/muscle spasticity: Continue supportive management, medications per the primary team.   Anemia/hyperlipidemia: Management per the primary team.   Protein calorie malnutrition: Dietitian consulted.  Management per the primary team.   Due to his complex medical problems he is high risk for worsening or decompensation.  Plan of care discussed with the primary team and pharmacy.  Subjective: Afebrile. No acute events reported.  Objective: Vitals stable  Examination: Constitutional: Awake, not in any acute distress Head: Atraumatic, normocephalic Eyes: PERLA, EOMI  ENMT: external ears and nose appear normal, normal hearing, lips normal, moist oral mucosa Neck: Supple CVS: S1-S2 clear, no murmur  Respiratory: Decreased breath sounds lower lobes, no rhonchi or wheezing.  Abdomen: soft nontender, positive bowel sounds, ostomy Musculoskeletal: Bilateral AKA  Neuro: Bilateral AKA, debility with  generalized weakness Psych: Stable Skin: no rashes, stage IV sacral pressure ulcer  Data Reviewed: I have personally reviewed following labs and imaging studies  CBC: Recent Labs  Lab 11/05/21 0313 11/08/21 0403 11/09/21 0631  WBC 6.9 6.7 6.1  HGB 8.8* 9.2* 9.5*  HCT 27.0* 28.7* 30.2*  MCV 85.2 85.7 86.0  PLT 399 416* 452*    Basic Metabolic Panel: Recent Labs  Lab 11/05/21 0313 11/08/21 0403 11/08/21 1622 11/09/21 0631 11/11/21 0637  NA 133* 133* 135 133* 136  K 3.8 4.0 4.2 4.4 4.0  CL 102 102 103 99 102  CO2 25 25 25 25 25   GLUCOSE 91 108* 105* 102* 109*  BUN 14 11 14 12 15   CREATININE 0.41* 0.45* 0.48* 0.43* 0.42*  CALCIUM 8.2* 8.2* 8.1* 8.5* 8.3*  MG 2.0  --   --  2.1  --     GFR: CrCl cannot be calculated (Unknown ideal weight.).  Liver Function Tests: No results for input(s): "AST", "ALT", "ALKPHOS", "BILITOT", "PROT", "ALBUMIN" in the last 168 hours.  CBG: No results for input(s): "GLUCAP" in the last 168 hours.   No results found for this or any previous visit (from the past 240 hour(s)).    Radiology Studies: No results found.  Scheduled Meds: Please see MAR  , MD   11/11/2021, 7:04 PM

## 2021-11-12 LAB — CK: Total CK: 33 U/L — ABNORMAL LOW (ref 49–397)

## 2022-11-30 LAB — COLOGUARD

## 2022-12-13 LAB — COLOGUARD: COLOGUARD: NEGATIVE

## 2023-10-20 ENCOUNTER — Encounter: Payer: Self-pay | Admitting: Physical Medicine and Rehabilitation

## 2023-10-22 ENCOUNTER — Encounter: Payer: Self-pay | Admitting: Advanced Practice Midwife

## 2024-01-07 ENCOUNTER — Encounter: Payer: Self-pay | Admitting: Physical Medicine and Rehabilitation

## 2024-01-07 ENCOUNTER — Encounter: Attending: Physical Medicine and Rehabilitation | Admitting: Physical Medicine and Rehabilitation

## 2024-01-07 VITALS — BP 108/70 | HR 77 | Wt 180.0 lb

## 2024-01-07 DIAGNOSIS — Z993 Dependence on wheelchair: Secondary | ICD-10-CM | POA: Insufficient documentation

## 2024-01-07 DIAGNOSIS — Z433 Encounter for attention to colostomy: Secondary | ICD-10-CM | POA: Diagnosis present

## 2024-01-07 DIAGNOSIS — G8220 Paraplegia, unspecified: Secondary | ICD-10-CM | POA: Insufficient documentation

## 2024-01-07 DIAGNOSIS — Z9359 Other cystostomy status: Secondary | ICD-10-CM | POA: Diagnosis present

## 2024-01-07 DIAGNOSIS — Z89611 Acquired absence of right leg above knee: Secondary | ICD-10-CM | POA: Insufficient documentation

## 2024-01-07 DIAGNOSIS — Z89612 Acquired absence of left leg above knee: Secondary | ICD-10-CM | POA: Insufficient documentation

## 2024-01-07 NOTE — Progress Notes (Signed)
 Subjective:    Patient ID: Anthony [name], male    DOB: 02-23-1961, 63 y.o.   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  T9/10 fusion and extension; colostomy; SPC placement;   Here for evaluation of his SCI;    More subdued lately- more disappointed in medical profession.    Baclofen pump- Dr Wanda Hoff in [place]- manages it.  A little above 1000 mcg/day. 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  5-6 months ago- flap was 4-5 months prior to opening up again.   One of caregivers didn't put cushion back in correctly and fell into seat-  and thinks that started it.  When got new w/c and cushion- got early April 2025- got after the flap-  and after it opened up back up again.  Changed the cushion Has a built up donut hole.  JAVA ride design- off the shelf- sacral well-  Did pressure mapping and now dropped down to nothing now.    Pain-  relatively little pain- occ has painful spasms-  Does have phantom sensation- wiggles toes and can feel them.  Urologist- managed by Dr Evalene Nam- goes monthly to get changed.   Colostomy- manages himself- done with B/L AKA's   Was wondering if could get ITB pump- filled here-   Social Hx:  Stays in w/c longer than should-  Max supposed to be 4 hours/day Used to work 8-10  hours/day- 50-60 hours/week.  And took care of family.  Bought self a low air loss mattress- 3500/month.  It's not a ROHO- [employer]- measured for it in [place].   Has donut hole in it-    Hates lying in bed.  And  doesn't like [facility]-  due to not being dx'd with thoracic abscess.  Collapsed and got MRI afterwards- were rushing down hallway to treatment.  Spent 9 days in ICU.     New w/c- power w/c New w/c pressure relieving cushion.  Side panel is in the way- Wearing side panel on R side- thigh abductor spsm, due to spasms.  Uses transfer board to transfer.  Caregivers 1x/night and needs help to do bathing and get LB dressing done.   Most of pain is R shoulder- going to [facility] Monday- to get injection   Hopeful will work, but if needs shoulder surgery, will need 6 weeks in SNF.     Pain Inventory Average Pain 4 Pain Right Now 3 My pain is .  In the last 24 hours, has pain interfered with the following? General activity 0 Relation with others 1 Enjoyment of life 0 What TIME of day is your pain at its worst? evening Sleep (in general) Poor  Pain is worse with: . Pain improves with: medication Relief from Meds: 8  how many minutes can you  walk? 0 ability to climb steps?  no do you drive?  no use a wheelchair transfers alone  disabled: date disabled 09/28/17 I need assistance with the following:  dressing and bathing  bladder control problems numbness tingling suicidal thoughts  New pt  New pt    No family history on file. Social History   Socioeconomic History   Marital status: Single    Spouse name: Not on file   Number of children: Not on file   Years of education: Not on file   Highest education level: Not on file  Occupational History   Not on file  Tobacco Use   Smoking status: Never   Smokeless tobacco: Never  Substance and Sexual Activity   Alcohol use: No   Drug use: No   Sexual activity: Not on file  Other Topics  Concern   Not on file  Social History Narrative   Not on file   Social Drivers of Health   Financial Resource Strain: Low Risk  (11/21/2021)   Received from [facility]   Overall Financial Resource Strain (CARDIA)    Difficulty of Paying Living Expenses: Not hard at all  Food Insecurity: Low Risk  (12/16/2022)   Received from [facility]   Hunger Vital Sign    Within the past 12 months, you worried that your food would run out before you got money to buy more: Never true    Within the past 12 months, the food you bought just didn't last and you didn't have money to get more. : Never true  Transportation Needs: No Transportation Needs (12/16/2022)   Received from [facility]    In the past 12 months, has lack of reliable transportation kept you from medical appointments, meetings, work or from getting things needed for daily living? : No  Physical Activity: Inactive (06/06/2021)   Received from [facility] visits prior to 06/06/2022., [facility]   Exercise Vital Sign    On average, how many days per week do you engage in moderate to strenuous exercise (like a brisk walk)?: 0 days    On average, how many minutes do you engage in exercise at this level?: 0 min  Stress: Stress Concern Present (06/06/2021)   Received from [facility] visits prior to 06/06/2022., [facility]   [employer] of Occupational Health - Occupational Stress Questionnaire    Feeling of Stress : Very much  Social Connections: Socially Isolated (06/06/2021)   Received from [facility] visits prior to 06/06/2022., [facility]   Social Connection and Isolation Panel    In a typical week, how many times do you talk on the phone with family, friends, or neighbors?: Once a week    How often do you get together with friends or relatives?: Never    How often do you attend church or religious services?: Never    Do you belong to any  clubs or organizations such as church groups, unions, fraternal or athletic groups, or school groups?: No    How often do you attend meetings of the clubs or organizations you belong to?: Never    Are you married, widowed, divorced, separated, never married, or living with a partner?: Divorced   Past Surgical History:  Procedure Laterality Date   APPENDECTOMY  oct 2014   JOINT REPLACEMENT Right jan 2009   KNEE ARTHROSCOPY Right 07/27/2013   Procedure: RIGHT ARTHROSCOPY KNEE WITH MEDIAL MENSICUS  DEBRIDEMENT;  Surgeon: Dempsey LULLA Moan, MD;  Location: [facility];  Service: Orthopedics;  Laterality: Right;   SPINAL FUSION  May 31, 2013   C 1 to T 1 at [facility]   Past Medical History:  Diagnosis Date   Broken rib 07/24/2013   s/p fall   GERD (gastroesophageal reflux disease)    Hypertension    PONV (postoperative nausea and vomiting)    occasional nausea, no vomiting   BP 108/70   Pulse 77   Wt 180 lb (81.6 kg)   SpO2 96%   Opioid Risk Score:   Fall Risk Score:  `1  Depression screen [facility] 2/9     01/07/2024   10:00 AM 04/22/2015   11:22 AM  Depression screen PHQ 2/9  Decreased Interest 2 0  Down, Depressed, Hopeless 2 0  PHQ - 2 Score 4 0  Altered sleeping 3 3  Tired, decreased energy 3 1  Change in appetite 3 1  Feeling bad or failure about yourself  0 0  Trouble concentrating 1 0  Moving slowly or fidgety/restless 0 0  Suicidal thoughts 0 0   PHQ-9 Score 14 5  Difficult doing work/chores Not difficult at all Not difficult at all     Data saved with a previous flowsheet row definition     Review of Systems An entire ROS was per HPI- negative otherwise.     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  T9/10 fusion and extension; colostomy; SPC placement;   Here for evaluation of his SCI;   [employer]- [phone]- direct number-  [phone] local [place] branch-  3. I agree with new w/c and cushion- d/w [employer].   4. ITB pump Managed by Dr Arrie. Does not want to change. Which is good.   5.   D/w SCI, his medical hx and what he's doing for B/B, and spasticity.    6. Has tried Lidocaine  and voltaren gel- not helpful- using Capsacin- which works best- using daily- can use QID  7. Doing everything right except pressure relief-  need to do 2-3x/hour- when in w/c- ALL the way back!    8. F/U- 3-6 months if pt wants- double appt- SCI  I spent a total of 63   minutes on total care today- >50% coordination of care- due to d/w pt about complex medical hx, surgical hx; and calling [employer] about cushion and w/c- and pressure relief.

## 2024-01-07 NOTE — Patient Instructions (Signed)
 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  T9/10 fusion and extension; colostomy; SPC placement;   Here for evaluation of his SCI;   [employer]- [phone]- direct number-  [phone] local [place] branch-  3. I agree with new w/c and cushion- d/w [employer].   4. ITB pump Managed by Dr Arrie. Does not want to change. Which is good.   5.   D/w SCI, his medical hx and what he's doing for B/B, and spasticity.    6. Has tried Lidocaine  and voltaren gel- not helpful- using Capsacin- which works best- using daily-   7. Doing everything right except pressure relief-  need to do 2-3x/hour- when in w/c- ALL the way back!    8. F/U- 3-6 months if pt wants- double appt- SCI

## 2024-04-12 ENCOUNTER — Encounter: Attending: Physical Medicine and Rehabilitation | Admitting: Physical Medicine and Rehabilitation

## 2024-04-12 ENCOUNTER — Encounter: Payer: Self-pay | Admitting: Physical Medicine and Rehabilitation

## 2024-04-12 VITALS — BP 113/71 | HR 79 | Ht 68.0 in

## 2024-04-12 DIAGNOSIS — Z993 Dependence on wheelchair: Secondary | ICD-10-CM | POA: Diagnosis not present

## 2024-04-12 DIAGNOSIS — Z89611 Acquired absence of right leg above knee: Secondary | ICD-10-CM | POA: Insufficient documentation

## 2024-04-12 DIAGNOSIS — Z433 Encounter for attention to colostomy: Secondary | ICD-10-CM | POA: Insufficient documentation

## 2024-04-12 DIAGNOSIS — Z9359 Other cystostomy status: Secondary | ICD-10-CM | POA: Insufficient documentation

## 2024-04-12 DIAGNOSIS — Z89612 Acquired absence of left leg above knee: Secondary | ICD-10-CM | POA: Diagnosis not present

## 2024-04-12 DIAGNOSIS — G8220 Paraplegia, unspecified: Secondary | ICD-10-CM | POA: Insufficient documentation

## 2024-04-12 NOTE — Progress Notes (Signed)
 "  Subjective:    Patient ID: Anthony [name], male    DOB: 15-May-1960, 64 y.o.   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  T9/10 fusion and extension; colostomy; SPC placement;    Here for f/u  of his SCI;     Had a bad cold-  got real stuffy-  UTD on vaccines  Wound going well- top wound pretty much closed up and other one is Shrinking  A lot time of bed-  18-20 hours/day.  VAC in place-  changing it 3x/week, but very little drainage lately.  Goes to wound care  Has people to come to home 1 hour/day that change VAC when needed.    Doing better with pressure relief- And power w/c that he has makes it easier to do pressure relief.   Got w/c in March 2025- Bought 2 more cushions- Java 1818- not Used Numotion for w/c- Odis Lipoma  Originally when got w/c- had defective battery- wouldn't hold charge.   We discussed him dropping things a lot-  Feels pain more intensely at older age.   Still having leg spasms- and B/L rotator cuff injuries- never repaired.   Has ITB pump >1000 mcg/day- before that, was on 3 Spasticity meds Rarely takes tramadol    Uses med box for 2 weeks- Has phantom sensation all the time- can feel feet- and rarely has spasms unless forgets gabapentin    Difficulty taking in protein- sometimes only eats 1x/day- and not hungry rest of day-  Prone to UTIs, so drinks a lot- Diet consists of egg whites, lean turkey- protein shakes-  amazon.  Greek yogurt,  blueberries and strawberries-   and mix in yogurt.   Then not hungry again for 18 hours.   Double scops protein- and it fills him up-   Cautious about calorie intake  Mood- is Im alive and kids come to visit- frequently-  Joking throughout!      Pain Inventory Average Pain 3 Pain Right Now 3 My pain is intermittent and aching  In the last 24 hours, has pain interfered with the following? General activity 6 Relation with others 4 Enjoyment of life 1 What TIME of day is your pain at its worst? evening and night Sleep (in general) Good  Pain is worse with: inactivity Pain improves with: rest and medication Relief from Meds: 4  No family history on file. Social History   Socioeconomic History   Marital status: Single    Spouse name: Not on file   Number of children: Not on file   Years of education: Not on file   Highest education level: Not on file  Occupational History   Not on file  Tobacco Use   Smoking status: Never   Smokeless tobacco: Never  Vaping Use   Vaping status: Never Used  Substance and Sexual Activity   Alcohol use: No   Drug use: No   Sexual activity: Not on file  Other Topics Concern   Not on file  Social History Narrative   Not on file   Social Drivers of Health   Tobacco Use: Low Risk (04/12/2024)   Patient History    Smoking Tobacco Use: Never    Smokeless Tobacco Use: Never    Passive Exposure: Not on file  Financial Resource Strain: Low Risk (11/21/2021)   Received from [facility]   Overall Financial Resource Strain (CARDIA)    Difficulty of Paying Living Expenses: Not hard at all  Food Insecurity: Low Risk (03/17/2024)   Received from [facility]   Epic    Within the past 12 months, you worried that your food would run out before you got money to buy more: Never true    Within the past 12 months, the food you bought just didn't last and you didn't have money to get more. : Never true  Transportation Needs: Unmet Transportation Needs (03/17/2024)    Received from [facility]    In the past 12 months, has lack of reliable transportation kept you from medical appointments, meetings, work or from getting things needed for daily living? : Yes  Physical Activity: Inactive (06/06/2021)   Received from [facility] visits prior to 06/06/2022., [facility]   Exercise Vital Sign    On average, how many days per week do you engage in moderate to strenuous exercise (like a brisk walk)?: 0 days    On average, how many minutes do you engage in exercise at this level?: 0 min  Stress: Stress Concern Present (06/06/2021)   Received from [facility] visits prior to 06/06/2022., [facility]   [employer] of Occupational Health - Occupational Stress Questionnaire    Feeling of Stress : Very much  Social Connections: Socially Isolated (06/06/2021)   Received [facility] visits prior to 06/06/2022., [facility]   Social Connection and Isolation Panel    In a typical week, how many times do you talk on the phone with family, friends, or neighbors?: Once a week    How often do you get together with friends or relatives?: Never    How often do you attend church or religious services?: Never    Do you belong to any clubs or organizations such as church groups, unions, fraternal or athletic groups, or school groups?: No    How often do you attend meetings of the clubs or organizations you belong to?: Never    Are you married, widowed, divorced, separated, never married, or living with a partner?: Divorced  Depression (PHQ2-9): High Risk (01/07/2024)   Depression (PHQ2-9)    PHQ-2 Score: 14  Alcohol Screen: Not on file  Housing: Low Risk (03/17/2024)   Received from [facility]   Epic    What is your living situation today?: I have a steady place to live    Think about the place you live. Do you have problems with any of the following? Choose all that apply:: None/None  on this list  Utilities: Low Risk (03/17/2024)   Received from [facility]   Utilities    In the past 12 months has the electric, gas, oil, or water company threatened to shut off services in your home? : No  Health Literacy: Low Risk (04/23/2021)   Received from [facility] Literacy    How often do you need to have someone help you when you read instructions, pamphlets, or other written material from your doctor  or pharmacy?: Never   Past Surgical History:  Procedure Laterality Date   APPENDECTOMY  oct 2014   JOINT REPLACEMENT Right jan 2009   KNEE ARTHROSCOPY Right 07/27/2013   Procedure: RIGHT ARTHROSCOPY KNEE WITH MEDIAL MENSICUS DEBRIDEMENT;  Surgeon: Dempsey LULLA Moan, MD;  Location: [facility];  Service: Orthopedics;  Laterality: Right;   SPINAL FUSION  May 31, 2013   C 1 to T 1 at [facility]   Past Surgical History:  Procedure Laterality Date   APPENDECTOMY  oct 2014   JOINT REPLACEMENT Right jan 2009   KNEE ARTHROSCOPY Right 07/27/2013   Procedure: RIGHT ARTHROSCOPY KNEE WITH MEDIAL MENSICUS DEBRIDEMENT;  Surgeon: Dempsey LULLA Moan, MD;  Location: [facility];  Service: Orthopedics;  Laterality: Right;   SPINAL FUSION  May 31, 2013   C 1 to T 1 at [facility]   Past Medical History:  Diagnosis Date   Broken rib 07/24/2013   s/p fall   GERD (gastroesophageal reflux disease)    Hypertension    PONV (postoperative nausea and vomiting)    occasional nausea, no vomiting   Ht 5' 8 (1.727 m)   BMI 27.37 kg/m   Opioid Risk Score:   Fall Risk Score:  `1  Depression screen [facility] 2/9     01/07/2024   10:00 AM 04/22/2015   11:22 AM  Depression screen PHQ 2/9  Decreased Interest 2 0  Down, Depressed, Hopeless 2 0  PHQ - 2 Score 4 0  Altered sleeping 3 3  Tired, decreased energy 3 1  Change in appetite 3 1  Feeling bad or failure about yourself  0 0  Trouble concentrating 1 0  Moving slowly or fidgety/restless 0 0  Suicidal thoughts 0 0   PHQ-9 Score 14  5   Difficult  doing work/chores Not difficult at all Not difficult at all     Data saved with a previous flowsheet row definition  Pain Inventory Average Pain 3 Pain Right Now 0 My pain is stabbing and tingling  In the last 24 hours, has pain interfered with the following? General activity 1 Relation with others 0 Enjoyment of life 1 What TIME of day is your pain at its worst? morning , daytime, evening, and night Sleep (in general) Poor  Pain is worse with: inactivity Pain improves with: medication, TENS, and injections Relief from Meds: 7       No family history on file. Social History   Socioeconomic History   Marital status: Single    Spouse name: Not on file   Number of children: Not on file   Years of education: Not on file   Highest education level: Not on file  Occupational History   Not on file  Tobacco Use   Smoking status: Never   Smokeless tobacco: Never  Vaping Use   Vaping status: Never Used  Substance and Sexual Activity   Alcohol use: No   Drug use: No   Sexual activity: Not on file  Other Topics Concern   Not on file  Social History Narrative   Not on file   Social Drivers of Health   Tobacco Use: Low Risk (04/12/2024)   Patient History    Smoking Tobacco Use: Never    Smokeless Tobacco Use: Never    Passive Exposure: Not on file  Financial Resource Strain: Low Risk (11/21/2021)   Received from [facility]   Overall Financial Resource Strain (CARDIA)    Difficulty of Paying Living Expenses: Not hard  at all  Food Insecurity: Low Risk (03/17/2024)   Received from [facility]   Epic    Within the past 12 months, you worried that your food would run out before you got money to buy more: Never true    Within the past 12 months, the food you bought just didn't last and you didn't have money to get more. : Never true  Transportation Needs: Unmet Transportation Needs (03/17/2024)   Received from [facility]    In the past 12 months,  has lack of reliable transportation kept you from medical appointments, meetings, work or from getting things needed for daily living? : Yes  Physical Activity: Inactive (06/06/2021)   Received [facility] visits prior to 06/06/2022., [facility]   Exercise Vital Sign    On average, how many days per week do you engage in moderate to strenuous exercise (like a brisk walk)?: 0 days    On average, how many minutes do you engage in exercise at this level?: 0 min  Stress: Stress Concern Present (06/06/2021)   Received from [facility] visits prior to 06/06/2022., [facility]   [employer] of Occupational Health - Occupational Stress Questionnaire    Feeling of Stress : Very much  Social Connections: Socially Isolated (06/06/2021)   Received [facility] visits prior to 06/06/2022., [facility]   Social Connection and Isolation Panel    In a typical week, how many times do you talk on the phone with family, friends, or neighbors?: Once a week    How often do you get together with friends or relatives?: Never    How often do you attend church or religious services?: Never    Do you belong to any clubs or organizations such as church groups, unions, fraternal or athletic groups, or school groups?: No    How often do you attend meetings of the clubs or organizations you belong to?: Never    Are you married, widowed, divorced, separated, never married, or living with a partner?: Divorced  Depression (PHQ2-9): High Risk (01/07/2024)   Depression (PHQ2-9)    PHQ-2 Score: 14  Alcohol Screen: Not on file  Housing: Low Risk (03/17/2024)   Received from [facility]   Epic    What is your living situation today?: I have a steady place to live    Think about the place you live. Do you have problems with any of the following? Choose all that apply:: None/None on this list  Utilities: Low Risk (03/17/2024)   Received [facility]   Utilities    In the past 12 months has the electric, gas, oil, or water company threatened to shut off services in your home? : No  Health Literacy: Low Risk (04/23/2021)   Received from [facility] Literacy    How often do you need to have someone help you when you read instructions, pamphlets, or other written material from your doctor or pharmacy?: Never   Past Surgical History:  Procedure Laterality Date   APPENDECTOMY  oct 2014   JOINT REPLACEMENT Right jan 2009   KNEE ARTHROSCOPY Right 07/27/2013   Procedure: RIGHT ARTHROSCOPY KNEE WITH MEDIAL MENSICUS DEBRIDEMENT;  Surgeon: Dempsey LULLA Moan, MD;  Location: [facility];  Service: Orthopedics;  Laterality: Right;   SPINAL FUSION  May 31, 2013   C 1 to T 1 at [facility]   Past Medical  History:  Diagnosis Date   Broken rib 07/24/2013   s/p fall   GERD (gastroesophageal reflux disease)    Hypertension    PONV (postoperative nausea and vomiting)    occasional nausea, no vomiting   Ht 5' 8 (1.727 m)   BMI 27.37 kg/m   Opioid Risk Score:   Fall Risk Score:  `1  Depression screen [facility] 2/9     01/07/2024   10:00 AM 04/22/2015   11:22 AM  Depression screen PHQ 2/9  Decreased Interest 2 0  Down, Depressed, Hopeless 2 0  PHQ - 2 Score 4 0  Altered sleeping 3 3  Tired, decreased energy 3 1  Change in appetite 3 1  Feeling bad or failure about yourself  0 0  Trouble concentrating 1 0  Moving slowly or fidgety/restless 0 0  Suicidal thoughts 0 0   PHQ-9 Score 14  5   Difficult doing work/chores Not difficult at all Not difficult at all     Data saved with a previous flowsheet row definition    Review of Systems  Musculoskeletal:  Positive for back pain.       Pain in both shoulders, both upper legs  All other systems reviewed and are negative.      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  T9/10 fusion and extension; colostomy; SPC placement;    Here for f/u of his SCI;     Wounds are healing per pt-  but still has some pressure/bruising- - so we discussed trying to do pressure relief q20 minutes-   2. In bed , try to do pressure relief q2 hours- and uses wedge to get pressure off backside and keep in position.   3.  Hx of flap that opened back up- due to sitting in w/c- and cushion not in correctly- and tore incision.    4. Worth asking if can get another flap- in the long run- to see what the end game so know the plan.    5.   F/U in 6 months- double appt- SCI and on wounds     I spent a total of  36  minutes on total care today- >50% coordination of care- due to  d/w pt about mood- and d/w pt about social issues- and wound/pressure ulcers   "

## 2024-04-12 NOTE — Patient Instructions (Signed)
 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  T9/10 fusion and extension; colostomy; SPC placement;    Here for f/u of his SCI;     Wounds are healing per pt-  but still has some pressure/bruising- - so we discussed trying to do pressure relief q20 minutes-   2. In bed , try to do pressure relief q2 hours- and uses wedge to get pressure off backside and keep in position.   3.  Hx of flap that opened back up- due to sitting in w/c- and cushion not in correctly- and tore incision.    4. Worth asking if can get another flap- in the long run- to see what the end game so know the plan.    5.   F/U in 6 months- double appt- SCI and on wounds

## 2024-10-11 ENCOUNTER — Encounter: Attending: Physical Medicine and Rehabilitation | Admitting: Physical Medicine and Rehabilitation
